# Patient Record
Sex: Male | Born: 1935 | Race: White | Hispanic: No
Health system: Southern US, Community
[De-identification: ages and names within clinical notes are randomized; demographics above are authoritative.]

---

## 2015-07-24 DIAGNOSIS — L97921 Non-pressure chronic ulcer of unspecified part of left lower leg limited to breakdown of skin: Secondary | ICD-10-CM | POA: Diagnosis not present

## 2015-07-24 DIAGNOSIS — I8310 Varicose veins of unspecified lower extremity with inflammation: Secondary | ICD-10-CM | POA: Diagnosis not present

## 2015-07-31 DIAGNOSIS — L97921 Non-pressure chronic ulcer of unspecified part of left lower leg limited to breakdown of skin: Secondary | ICD-10-CM | POA: Diagnosis not present

## 2015-07-31 DIAGNOSIS — I8310 Varicose veins of unspecified lower extremity with inflammation: Secondary | ICD-10-CM | POA: Diagnosis not present

## 2015-07-31 DIAGNOSIS — R600 Localized edema: Secondary | ICD-10-CM | POA: Diagnosis not present

## 2015-08-07 DIAGNOSIS — R600 Localized edema: Secondary | ICD-10-CM | POA: Diagnosis not present

## 2015-08-07 DIAGNOSIS — L97921 Non-pressure chronic ulcer of unspecified part of left lower leg limited to breakdown of skin: Secondary | ICD-10-CM | POA: Diagnosis not present

## 2015-08-07 DIAGNOSIS — I8310 Varicose veins of unspecified lower extremity with inflammation: Secondary | ICD-10-CM | POA: Diagnosis not present

## 2015-08-14 DIAGNOSIS — L97921 Non-pressure chronic ulcer of unspecified part of left lower leg limited to breakdown of skin: Secondary | ICD-10-CM | POA: Diagnosis not present

## 2015-08-14 DIAGNOSIS — I8310 Varicose veins of unspecified lower extremity with inflammation: Secondary | ICD-10-CM | POA: Diagnosis not present

## 2015-08-21 DIAGNOSIS — I8310 Varicose veins of unspecified lower extremity with inflammation: Secondary | ICD-10-CM | POA: Diagnosis not present

## 2015-08-21 DIAGNOSIS — L97921 Non-pressure chronic ulcer of unspecified part of left lower leg limited to breakdown of skin: Secondary | ICD-10-CM | POA: Diagnosis not present

## 2015-08-21 DIAGNOSIS — R600 Localized edema: Secondary | ICD-10-CM | POA: Diagnosis not present

## 2015-08-28 DIAGNOSIS — I8310 Varicose veins of unspecified lower extremity with inflammation: Secondary | ICD-10-CM | POA: Diagnosis not present

## 2015-08-28 DIAGNOSIS — L97921 Non-pressure chronic ulcer of unspecified part of left lower leg limited to breakdown of skin: Secondary | ICD-10-CM | POA: Diagnosis not present

## 2015-09-04 DIAGNOSIS — L97921 Non-pressure chronic ulcer of unspecified part of left lower leg limited to breakdown of skin: Secondary | ICD-10-CM | POA: Diagnosis not present

## 2015-09-04 DIAGNOSIS — I8310 Varicose veins of unspecified lower extremity with inflammation: Secondary | ICD-10-CM | POA: Diagnosis not present

## 2015-09-10 DIAGNOSIS — R7989 Other specified abnormal findings of blood chemistry: Secondary | ICD-10-CM | POA: Diagnosis not present

## 2015-09-10 DIAGNOSIS — E041 Nontoxic single thyroid nodule: Secondary | ICD-10-CM | POA: Diagnosis not present

## 2015-09-25 DIAGNOSIS — L97921 Non-pressure chronic ulcer of unspecified part of left lower leg limited to breakdown of skin: Secondary | ICD-10-CM | POA: Diagnosis not present

## 2015-11-05 DIAGNOSIS — K5900 Constipation, unspecified: Secondary | ICD-10-CM | POA: Diagnosis not present

## 2015-11-07 DIAGNOSIS — R1084 Generalized abdominal pain: Secondary | ICD-10-CM | POA: Diagnosis not present

## 2015-11-07 DIAGNOSIS — K5909 Other constipation: Secondary | ICD-10-CM | POA: Diagnosis not present

## 2015-11-07 DIAGNOSIS — E039 Hypothyroidism, unspecified: Secondary | ICD-10-CM | POA: Diagnosis not present

## 2015-11-07 DIAGNOSIS — R109 Unspecified abdominal pain: Secondary | ICD-10-CM | POA: Diagnosis not present

## 2015-11-07 DIAGNOSIS — K5901 Slow transit constipation: Secondary | ICD-10-CM | POA: Diagnosis not present

## 2015-11-07 DIAGNOSIS — Z79899 Other long term (current) drug therapy: Secondary | ICD-10-CM | POA: Diagnosis not present

## 2015-12-02 DIAGNOSIS — H02135 Senile ectropion of left lower eyelid: Secondary | ICD-10-CM | POA: Diagnosis not present

## 2015-12-02 DIAGNOSIS — H2513 Age-related nuclear cataract, bilateral: Secondary | ICD-10-CM | POA: Diagnosis not present

## 2015-12-02 DIAGNOSIS — H524 Presbyopia: Secondary | ICD-10-CM | POA: Diagnosis not present

## 2017-05-01 DIAGNOSIS — J050 Acute obstructive laryngitis [croup]: Secondary | ICD-10-CM | POA: Diagnosis not present

## 2017-05-01 DIAGNOSIS — J0100 Acute maxillary sinusitis, unspecified: Secondary | ICD-10-CM | POA: Diagnosis not present

## 2017-05-01 DIAGNOSIS — M79675 Pain in left toe(s): Secondary | ICD-10-CM | POA: Diagnosis not present

## 2018-07-14 DIAGNOSIS — I739 Peripheral vascular disease, unspecified: Secondary | ICD-10-CM | POA: Diagnosis not present

## 2018-07-14 DIAGNOSIS — E039 Hypothyroidism, unspecified: Secondary | ICD-10-CM | POA: Diagnosis not present

## 2018-07-14 DIAGNOSIS — M40293 Other kyphosis, cervicothoracic region: Secondary | ICD-10-CM | POA: Diagnosis not present

## 2018-07-14 DIAGNOSIS — R609 Edema, unspecified: Secondary | ICD-10-CM | POA: Diagnosis not present

## 2018-07-14 DIAGNOSIS — M542 Cervicalgia: Secondary | ICD-10-CM | POA: Diagnosis not present

## 2018-07-15 DIAGNOSIS — M546 Pain in thoracic spine: Secondary | ICD-10-CM | POA: Diagnosis not present

## 2018-07-15 DIAGNOSIS — R609 Edema, unspecified: Secondary | ICD-10-CM | POA: Diagnosis not present

## 2018-07-15 DIAGNOSIS — M438X4 Other specified deforming dorsopathies, thoracic region: Secondary | ICD-10-CM | POA: Diagnosis not present

## 2018-07-15 DIAGNOSIS — M47814 Spondylosis without myelopathy or radiculopathy, thoracic region: Secondary | ICD-10-CM | POA: Diagnosis not present

## 2018-07-15 DIAGNOSIS — M40292 Other kyphosis, cervical region: Secondary | ICD-10-CM | POA: Diagnosis not present

## 2018-07-15 DIAGNOSIS — R600 Localized edema: Secondary | ICD-10-CM | POA: Diagnosis not present

## 2018-07-15 DIAGNOSIS — M47812 Spondylosis without myelopathy or radiculopathy, cervical region: Secondary | ICD-10-CM | POA: Diagnosis not present

## 2018-07-15 DIAGNOSIS — M542 Cervicalgia: Secondary | ICD-10-CM | POA: Diagnosis not present

## 2018-07-15 DIAGNOSIS — M8588 Other specified disorders of bone density and structure, other site: Secondary | ICD-10-CM | POA: Diagnosis not present

## 2018-08-01 DIAGNOSIS — D513 Other dietary vitamin B12 deficiency anemia: Secondary | ICD-10-CM | POA: Diagnosis not present

## 2018-08-22 DIAGNOSIS — C44722 Squamous cell carcinoma of skin of right lower limb, including hip: Secondary | ICD-10-CM | POA: Diagnosis not present

## 2018-09-05 DIAGNOSIS — L97921 Non-pressure chronic ulcer of unspecified part of left lower leg limited to breakdown of skin: Secondary | ICD-10-CM | POA: Diagnosis not present

## 2018-09-05 DIAGNOSIS — L570 Actinic keratosis: Secondary | ICD-10-CM | POA: Diagnosis not present

## 2018-09-10 DIAGNOSIS — S199XXA Unspecified injury of neck, initial encounter: Secondary | ICD-10-CM | POA: Diagnosis not present

## 2018-09-10 DIAGNOSIS — W19XXXA Unspecified fall, initial encounter: Secondary | ICD-10-CM | POA: Diagnosis not present

## 2018-09-10 DIAGNOSIS — S60512A Abrasion of left hand, initial encounter: Secondary | ICD-10-CM | POA: Diagnosis not present

## 2018-09-10 DIAGNOSIS — S0101XA Laceration without foreign body of scalp, initial encounter: Secondary | ICD-10-CM | POA: Diagnosis not present

## 2018-09-10 DIAGNOSIS — S0990XA Unspecified injury of head, initial encounter: Secondary | ICD-10-CM | POA: Diagnosis not present

## 2018-09-10 DIAGNOSIS — S61012A Laceration without foreign body of left thumb without damage to nail, initial encounter: Secondary | ICD-10-CM | POA: Diagnosis not present

## 2018-09-10 DIAGNOSIS — R0902 Hypoxemia: Secondary | ICD-10-CM | POA: Diagnosis not present

## 2018-09-10 DIAGNOSIS — S0001XA Abrasion of scalp, initial encounter: Secondary | ICD-10-CM | POA: Diagnosis not present

## 2018-09-12 DIAGNOSIS — L97911 Non-pressure chronic ulcer of unspecified part of right lower leg limited to breakdown of skin: Secondary | ICD-10-CM | POA: Diagnosis not present

## 2018-09-19 DIAGNOSIS — L97911 Non-pressure chronic ulcer of unspecified part of right lower leg limited to breakdown of skin: Secondary | ICD-10-CM | POA: Diagnosis not present

## 2018-09-30 DIAGNOSIS — I8310 Varicose veins of unspecified lower extremity with inflammation: Secondary | ICD-10-CM | POA: Diagnosis not present

## 2018-09-30 DIAGNOSIS — L97911 Non-pressure chronic ulcer of unspecified part of right lower leg limited to breakdown of skin: Secondary | ICD-10-CM | POA: Diagnosis not present

## 2018-09-30 DIAGNOSIS — R600 Localized edema: Secondary | ICD-10-CM | POA: Diagnosis not present

## 2018-10-06 DIAGNOSIS — L97911 Non-pressure chronic ulcer of unspecified part of right lower leg limited to breakdown of skin: Secondary | ICD-10-CM | POA: Diagnosis not present

## 2018-10-06 DIAGNOSIS — I8310 Varicose veins of unspecified lower extremity with inflammation: Secondary | ICD-10-CM | POA: Diagnosis not present

## 2018-10-13 DIAGNOSIS — L97911 Non-pressure chronic ulcer of unspecified part of right lower leg limited to breakdown of skin: Secondary | ICD-10-CM | POA: Diagnosis not present

## 2018-10-13 DIAGNOSIS — R600 Localized edema: Secondary | ICD-10-CM | POA: Diagnosis not present

## 2018-10-13 DIAGNOSIS — I8310 Varicose veins of unspecified lower extremity with inflammation: Secondary | ICD-10-CM | POA: Diagnosis not present

## 2018-10-27 DIAGNOSIS — E559 Vitamin D deficiency, unspecified: Secondary | ICD-10-CM | POA: Diagnosis not present

## 2018-10-27 DIAGNOSIS — I739 Peripheral vascular disease, unspecified: Secondary | ICD-10-CM | POA: Diagnosis not present

## 2018-10-27 DIAGNOSIS — Z23 Encounter for immunization: Secondary | ICD-10-CM | POA: Diagnosis not present

## 2018-10-27 DIAGNOSIS — E039 Hypothyroidism, unspecified: Secondary | ICD-10-CM | POA: Diagnosis not present

## 2018-10-27 DIAGNOSIS — Z299 Encounter for prophylactic measures, unspecified: Secondary | ICD-10-CM | POA: Diagnosis not present

## 2018-10-27 DIAGNOSIS — Z789 Other specified health status: Secondary | ICD-10-CM | POA: Diagnosis not present

## 2018-10-27 DIAGNOSIS — D513 Other dietary vitamin B12 deficiency anemia: Secondary | ICD-10-CM | POA: Diagnosis not present

## 2018-10-27 DIAGNOSIS — F0280 Dementia in other diseases classified elsewhere without behavioral disturbance: Secondary | ICD-10-CM | POA: Diagnosis not present

## 2018-10-27 DIAGNOSIS — Z6824 Body mass index (BMI) 24.0-24.9, adult: Secondary | ICD-10-CM | POA: Diagnosis not present

## 2018-10-27 DIAGNOSIS — G309 Alzheimer's disease, unspecified: Secondary | ICD-10-CM | POA: Diagnosis not present

## 2018-10-27 DIAGNOSIS — I10 Essential (primary) hypertension: Secondary | ICD-10-CM | POA: Diagnosis not present

## 2018-10-27 DIAGNOSIS — G4700 Insomnia, unspecified: Secondary | ICD-10-CM | POA: Diagnosis not present

## 2018-10-27 DIAGNOSIS — N183 Chronic kidney disease, stage 3 unspecified: Secondary | ICD-10-CM | POA: Diagnosis not present

## 2018-10-27 DIAGNOSIS — I2510 Atherosclerotic heart disease of native coronary artery without angina pectoris: Secondary | ICD-10-CM | POA: Diagnosis not present

## 2018-10-27 DIAGNOSIS — R413 Other amnesia: Secondary | ICD-10-CM | POA: Diagnosis not present

## 2018-10-27 DIAGNOSIS — F332 Major depressive disorder, recurrent severe without psychotic features: Secondary | ICD-10-CM | POA: Diagnosis not present

## 2018-10-27 DIAGNOSIS — L900 Lichen sclerosus et atrophicus: Secondary | ICD-10-CM | POA: Diagnosis not present

## 2018-10-31 DIAGNOSIS — E539 Vitamin B deficiency, unspecified: Secondary | ICD-10-CM | POA: Diagnosis not present

## 2018-10-31 DIAGNOSIS — E559 Vitamin D deficiency, unspecified: Secondary | ICD-10-CM | POA: Diagnosis not present

## 2018-10-31 DIAGNOSIS — L84 Corns and callosities: Secondary | ICD-10-CM | POA: Diagnosis not present

## 2018-11-08 DIAGNOSIS — L97911 Non-pressure chronic ulcer of unspecified part of right lower leg limited to breakdown of skin: Secondary | ICD-10-CM | POA: Diagnosis not present

## 2018-11-08 DIAGNOSIS — I8310 Varicose veins of unspecified lower extremity with inflammation: Secondary | ICD-10-CM | POA: Diagnosis not present

## 2018-11-08 DIAGNOSIS — R600 Localized edema: Secondary | ICD-10-CM | POA: Diagnosis not present

## 2018-11-24 DIAGNOSIS — L97911 Non-pressure chronic ulcer of unspecified part of right lower leg limited to breakdown of skin: Secondary | ICD-10-CM | POA: Diagnosis not present

## 2018-12-01 DIAGNOSIS — L97911 Non-pressure chronic ulcer of unspecified part of right lower leg limited to breakdown of skin: Secondary | ICD-10-CM | POA: Diagnosis not present

## 2018-12-01 DIAGNOSIS — L0390 Cellulitis, unspecified: Secondary | ICD-10-CM | POA: Diagnosis not present

## 2018-12-08 DIAGNOSIS — L97911 Non-pressure chronic ulcer of unspecified part of right lower leg limited to breakdown of skin: Secondary | ICD-10-CM | POA: Diagnosis not present

## 2018-12-20 DIAGNOSIS — L97911 Non-pressure chronic ulcer of unspecified part of right lower leg limited to breakdown of skin: Secondary | ICD-10-CM | POA: Diagnosis not present

## 2019-01-05 DIAGNOSIS — R600 Localized edema: Secondary | ICD-10-CM | POA: Diagnosis not present

## 2019-01-05 DIAGNOSIS — M79604 Pain in right leg: Secondary | ICD-10-CM | POA: Diagnosis not present

## 2019-01-05 DIAGNOSIS — L97911 Non-pressure chronic ulcer of unspecified part of right lower leg limited to breakdown of skin: Secondary | ICD-10-CM | POA: Diagnosis not present

## 2019-01-05 DIAGNOSIS — M7989 Other specified soft tissue disorders: Secondary | ICD-10-CM | POA: Diagnosis not present

## 2019-01-12 DIAGNOSIS — L97911 Non-pressure chronic ulcer of unspecified part of right lower leg limited to breakdown of skin: Secondary | ICD-10-CM | POA: Diagnosis not present

## 2019-01-12 DIAGNOSIS — D485 Neoplasm of uncertain behavior of skin: Secondary | ICD-10-CM | POA: Diagnosis not present

## 2019-01-12 DIAGNOSIS — L308 Other specified dermatitis: Secondary | ICD-10-CM | POA: Diagnosis not present

## 2019-01-17 DIAGNOSIS — L97911 Non-pressure chronic ulcer of unspecified part of right lower leg limited to breakdown of skin: Secondary | ICD-10-CM | POA: Diagnosis not present

## 2019-01-17 DIAGNOSIS — I8310 Varicose veins of unspecified lower extremity with inflammation: Secondary | ICD-10-CM | POA: Diagnosis not present

## 2019-01-17 DIAGNOSIS — R600 Localized edema: Secondary | ICD-10-CM | POA: Diagnosis not present

## 2019-01-24 DIAGNOSIS — L97911 Non-pressure chronic ulcer of unspecified part of right lower leg limited to breakdown of skin: Secondary | ICD-10-CM | POA: Diagnosis not present

## 2019-01-25 DIAGNOSIS — D513 Other dietary vitamin B12 deficiency anemia: Secondary | ICD-10-CM | POA: Diagnosis not present

## 2019-01-25 DIAGNOSIS — D519 Vitamin B12 deficiency anemia, unspecified: Secondary | ICD-10-CM | POA: Diagnosis not present

## 2019-01-25 DIAGNOSIS — I739 Peripheral vascular disease, unspecified: Secondary | ICD-10-CM | POA: Diagnosis not present

## 2019-01-25 DIAGNOSIS — E039 Hypothyroidism, unspecified: Secondary | ICD-10-CM | POA: Diagnosis not present

## 2019-01-26 DIAGNOSIS — E039 Hypothyroidism, unspecified: Secondary | ICD-10-CM | POA: Diagnosis not present

## 2019-01-26 DIAGNOSIS — E559 Vitamin D deficiency, unspecified: Secondary | ICD-10-CM | POA: Diagnosis not present

## 2019-01-26 DIAGNOSIS — D513 Other dietary vitamin B12 deficiency anemia: Secondary | ICD-10-CM | POA: Diagnosis not present

## 2019-02-02 DIAGNOSIS — R600 Localized edema: Secondary | ICD-10-CM | POA: Diagnosis not present

## 2019-02-02 DIAGNOSIS — L97911 Non-pressure chronic ulcer of unspecified part of right lower leg limited to breakdown of skin: Secondary | ICD-10-CM | POA: Diagnosis not present

## 2019-02-09 DIAGNOSIS — L97911 Non-pressure chronic ulcer of unspecified part of right lower leg limited to breakdown of skin: Secondary | ICD-10-CM | POA: Diagnosis not present

## 2019-03-02 DIAGNOSIS — I8310 Varicose veins of unspecified lower extremity with inflammation: Secondary | ICD-10-CM | POA: Diagnosis not present

## 2019-03-02 DIAGNOSIS — L97911 Non-pressure chronic ulcer of unspecified part of right lower leg limited to breakdown of skin: Secondary | ICD-10-CM | POA: Diagnosis not present

## 2019-03-02 DIAGNOSIS — R600 Localized edema: Secondary | ICD-10-CM | POA: Diagnosis not present

## 2019-03-16 DIAGNOSIS — S01311A Laceration without foreign body of right ear, initial encounter: Secondary | ICD-10-CM | POA: Diagnosis not present

## 2019-03-16 DIAGNOSIS — L97919 Non-pressure chronic ulcer of unspecified part of right lower leg with unspecified severity: Secondary | ICD-10-CM | POA: Diagnosis not present

## 2019-03-16 DIAGNOSIS — S080XXA Avulsion of scalp, initial encounter: Secondary | ICD-10-CM | POA: Diagnosis not present

## 2019-03-18 DIAGNOSIS — S080XXA Avulsion of scalp, initial encounter: Secondary | ICD-10-CM | POA: Diagnosis not present

## 2019-03-22 DIAGNOSIS — I87311 Chronic venous hypertension (idiopathic) with ulcer of right lower extremity: Secondary | ICD-10-CM | POA: Diagnosis not present

## 2019-03-22 DIAGNOSIS — I10 Essential (primary) hypertension: Secondary | ICD-10-CM | POA: Diagnosis not present

## 2019-03-22 DIAGNOSIS — L97812 Non-pressure chronic ulcer of other part of right lower leg with fat layer exposed: Secondary | ICD-10-CM | POA: Diagnosis not present

## 2019-03-22 DIAGNOSIS — J449 Chronic obstructive pulmonary disease, unspecified: Secondary | ICD-10-CM | POA: Diagnosis not present

## 2019-03-23 DIAGNOSIS — M25571 Pain in right ankle and joints of right foot: Secondary | ICD-10-CM | POA: Diagnosis not present

## 2019-03-23 DIAGNOSIS — R52 Pain, unspecified: Secondary | ICD-10-CM | POA: Diagnosis not present

## 2019-03-24 DIAGNOSIS — I739 Peripheral vascular disease, unspecified: Secondary | ICD-10-CM | POA: Diagnosis not present

## 2019-03-24 DIAGNOSIS — L03115 Cellulitis of right lower limb: Secondary | ICD-10-CM | POA: Diagnosis not present

## 2019-03-24 DIAGNOSIS — Z79899 Other long term (current) drug therapy: Secondary | ICD-10-CM | POA: Diagnosis not present

## 2019-03-24 DIAGNOSIS — S81801A Unspecified open wound, right lower leg, initial encounter: Secondary | ICD-10-CM | POA: Diagnosis not present

## 2019-03-24 DIAGNOSIS — J449 Chronic obstructive pulmonary disease, unspecified: Secondary | ICD-10-CM | POA: Diagnosis not present

## 2019-03-24 DIAGNOSIS — E039 Hypothyroidism, unspecified: Secondary | ICD-10-CM | POA: Diagnosis not present

## 2019-03-24 DIAGNOSIS — M1990 Unspecified osteoarthritis, unspecified site: Secondary | ICD-10-CM | POA: Diagnosis not present

## 2019-03-24 DIAGNOSIS — L97919 Non-pressure chronic ulcer of unspecified part of right lower leg with unspecified severity: Secondary | ICD-10-CM | POA: Diagnosis not present

## 2019-03-25 DIAGNOSIS — L03115 Cellulitis of right lower limb: Secondary | ICD-10-CM | POA: Diagnosis not present

## 2019-03-25 DIAGNOSIS — L97919 Non-pressure chronic ulcer of unspecified part of right lower leg with unspecified severity: Secondary | ICD-10-CM | POA: Diagnosis not present

## 2019-03-28 DIAGNOSIS — I10 Essential (primary) hypertension: Secondary | ICD-10-CM | POA: Diagnosis not present

## 2019-03-28 DIAGNOSIS — I70238 Atherosclerosis of native arteries of right leg with ulceration of other part of lower right leg: Secondary | ICD-10-CM | POA: Diagnosis not present

## 2019-03-28 DIAGNOSIS — L97812 Non-pressure chronic ulcer of other part of right lower leg with fat layer exposed: Secondary | ICD-10-CM | POA: Diagnosis not present

## 2019-03-28 DIAGNOSIS — I87311 Chronic venous hypertension (idiopathic) with ulcer of right lower extremity: Secondary | ICD-10-CM | POA: Diagnosis not present

## 2019-03-28 DIAGNOSIS — J449 Chronic obstructive pulmonary disease, unspecified: Secondary | ICD-10-CM | POA: Diagnosis not present

## 2019-03-29 DIAGNOSIS — Z79899 Other long term (current) drug therapy: Secondary | ICD-10-CM | POA: Diagnosis not present

## 2019-03-29 DIAGNOSIS — Z20828 Contact with and (suspected) exposure to other viral communicable diseases: Secondary | ICD-10-CM | POA: Diagnosis not present

## 2019-03-29 DIAGNOSIS — E039 Hypothyroidism, unspecified: Secondary | ICD-10-CM | POA: Diagnosis not present

## 2019-03-29 DIAGNOSIS — R079 Chest pain, unspecified: Secondary | ICD-10-CM | POA: Diagnosis not present

## 2019-03-31 DIAGNOSIS — Z79899 Other long term (current) drug therapy: Secondary | ICD-10-CM | POA: Diagnosis not present

## 2019-03-31 DIAGNOSIS — L97812 Non-pressure chronic ulcer of other part of right lower leg with fat layer exposed: Secondary | ICD-10-CM | POA: Diagnosis not present

## 2019-03-31 DIAGNOSIS — Z9181 History of falling: Secondary | ICD-10-CM | POA: Diagnosis not present

## 2019-03-31 DIAGNOSIS — I10 Essential (primary) hypertension: Secondary | ICD-10-CM | POA: Diagnosis not present

## 2019-03-31 DIAGNOSIS — Z791 Long term (current) use of non-steroidal anti-inflammatories (NSAID): Secondary | ICD-10-CM | POA: Diagnosis not present

## 2019-03-31 DIAGNOSIS — J310 Chronic rhinitis: Secondary | ICD-10-CM | POA: Diagnosis not present

## 2019-03-31 DIAGNOSIS — E039 Hypothyroidism, unspecified: Secondary | ICD-10-CM | POA: Diagnosis not present

## 2019-03-31 DIAGNOSIS — I87311 Chronic venous hypertension (idiopathic) with ulcer of right lower extremity: Secondary | ICD-10-CM | POA: Diagnosis not present

## 2019-03-31 DIAGNOSIS — R609 Edema, unspecified: Secondary | ICD-10-CM | POA: Diagnosis not present

## 2019-03-31 DIAGNOSIS — R2681 Unsteadiness on feet: Secondary | ICD-10-CM | POA: Diagnosis not present

## 2019-03-31 DIAGNOSIS — R5383 Other fatigue: Secondary | ICD-10-CM | POA: Diagnosis not present

## 2019-03-31 DIAGNOSIS — R531 Weakness: Secondary | ICD-10-CM | POA: Diagnosis not present

## 2019-03-31 DIAGNOSIS — Z7951 Long term (current) use of inhaled steroids: Secondary | ICD-10-CM | POA: Diagnosis not present

## 2019-03-31 DIAGNOSIS — J449 Chronic obstructive pulmonary disease, unspecified: Secondary | ICD-10-CM | POA: Diagnosis not present

## 2019-04-04 DIAGNOSIS — I70238 Atherosclerosis of native arteries of right leg with ulceration of other part of lower right leg: Secondary | ICD-10-CM | POA: Diagnosis not present

## 2019-04-04 DIAGNOSIS — I10 Essential (primary) hypertension: Secondary | ICD-10-CM | POA: Diagnosis not present

## 2019-04-04 DIAGNOSIS — J449 Chronic obstructive pulmonary disease, unspecified: Secondary | ICD-10-CM | POA: Diagnosis not present

## 2019-04-04 DIAGNOSIS — I87311 Chronic venous hypertension (idiopathic) with ulcer of right lower extremity: Secondary | ICD-10-CM | POA: Diagnosis not present

## 2019-04-04 DIAGNOSIS — L97812 Non-pressure chronic ulcer of other part of right lower leg with fat layer exposed: Secondary | ICD-10-CM | POA: Diagnosis not present

## 2019-04-07 DIAGNOSIS — E559 Vitamin D deficiency, unspecified: Secondary | ICD-10-CM | POA: Diagnosis not present

## 2019-04-07 DIAGNOSIS — Z79899 Other long term (current) drug therapy: Secondary | ICD-10-CM | POA: Diagnosis not present

## 2019-04-07 DIAGNOSIS — D649 Anemia, unspecified: Secondary | ICD-10-CM | POA: Diagnosis not present

## 2019-04-07 DIAGNOSIS — M40293 Other kyphosis, cervicothoracic region: Secondary | ICD-10-CM | POA: Diagnosis not present

## 2019-04-07 DIAGNOSIS — E039 Hypothyroidism, unspecified: Secondary | ICD-10-CM | POA: Diagnosis not present

## 2019-04-07 DIAGNOSIS — I739 Peripheral vascular disease, unspecified: Secondary | ICD-10-CM | POA: Diagnosis not present

## 2019-04-15 DIAGNOSIS — E039 Hypothyroidism, unspecified: Secondary | ICD-10-CM | POA: Diagnosis not present

## 2019-04-15 DIAGNOSIS — S0101XA Laceration without foreign body of scalp, initial encounter: Secondary | ICD-10-CM | POA: Diagnosis not present

## 2019-04-15 DIAGNOSIS — R42 Dizziness and giddiness: Secondary | ICD-10-CM | POA: Diagnosis not present

## 2019-04-15 DIAGNOSIS — W19XXXA Unspecified fall, initial encounter: Secondary | ICD-10-CM | POA: Diagnosis not present

## 2019-04-15 DIAGNOSIS — H6123 Impacted cerumen, bilateral: Secondary | ICD-10-CM | POA: Diagnosis not present

## 2019-04-15 DIAGNOSIS — S0003XA Contusion of scalp, initial encounter: Secondary | ICD-10-CM | POA: Diagnosis not present

## 2019-04-15 DIAGNOSIS — I10 Essential (primary) hypertension: Secondary | ICD-10-CM | POA: Diagnosis not present

## 2019-04-15 DIAGNOSIS — Z79899 Other long term (current) drug therapy: Secondary | ICD-10-CM | POA: Diagnosis not present

## 2019-04-15 DIAGNOSIS — R51 Headache: Secondary | ICD-10-CM | POA: Diagnosis not present

## 2019-04-15 DIAGNOSIS — I4510 Unspecified right bundle-branch block: Secondary | ICD-10-CM | POA: Diagnosis not present

## 2019-04-17 DIAGNOSIS — I70248 Atherosclerosis of native arteries of left leg with ulceration of other part of lower left leg: Secondary | ICD-10-CM | POA: Diagnosis not present

## 2019-04-17 DIAGNOSIS — L97812 Non-pressure chronic ulcer of other part of right lower leg with fat layer exposed: Secondary | ICD-10-CM | POA: Diagnosis not present

## 2019-04-17 DIAGNOSIS — I70238 Atherosclerosis of native arteries of right leg with ulceration of other part of lower right leg: Secondary | ICD-10-CM | POA: Diagnosis not present

## 2019-04-17 DIAGNOSIS — L97822 Non-pressure chronic ulcer of other part of left lower leg with fat layer exposed: Secondary | ICD-10-CM | POA: Diagnosis not present

## 2019-04-17 DIAGNOSIS — I87311 Chronic venous hypertension (idiopathic) with ulcer of right lower extremity: Secondary | ICD-10-CM | POA: Diagnosis not present

## 2019-04-24 ENCOUNTER — Other Ambulatory Visit: Payer: Self-pay | Admitting: *Deleted

## 2019-04-24 DIAGNOSIS — I70248 Atherosclerosis of native arteries of left leg with ulceration of other part of lower left leg: Secondary | ICD-10-CM | POA: Diagnosis not present

## 2019-04-24 DIAGNOSIS — L97812 Non-pressure chronic ulcer of other part of right lower leg with fat layer exposed: Secondary | ICD-10-CM | POA: Diagnosis not present

## 2019-04-24 DIAGNOSIS — I87311 Chronic venous hypertension (idiopathic) with ulcer of right lower extremity: Secondary | ICD-10-CM | POA: Diagnosis not present

## 2019-04-24 DIAGNOSIS — I70238 Atherosclerosis of native arteries of right leg with ulceration of other part of lower right leg: Secondary | ICD-10-CM | POA: Diagnosis not present

## 2019-04-24 DIAGNOSIS — L97822 Non-pressure chronic ulcer of other part of left lower leg with fat layer exposed: Secondary | ICD-10-CM | POA: Diagnosis not present

## 2019-04-24 NOTE — Patient Outreach (Signed)
[facility]) Care Management  04/24/2019  Antonio Estes. Apr 02, 1927 [id number]   CSW made initial contact with pt who confirmed his identity.  CSW introduced self and role and reason for call.   CSW began to attempt to ask some screening type questions; pt reports he lives alone, has help from his neighbors help and denies any needs.  Pt did not like the inquiries/questions being asked to assess further and CSW attempted to explain to him why [facility] was consulted and why I was asking the questions.  He continued to talk and answer a few questions but again became irritated by the (personal) questions and politely declined CSW services. CSW will advise PCP and [facility] team and plan to sign off at this time.   Eduard Clos, MSW, [place] Worker  [place] [phone]

## 2019-04-30 DIAGNOSIS — I87311 Chronic venous hypertension (idiopathic) with ulcer of right lower extremity: Secondary | ICD-10-CM | POA: Diagnosis not present

## 2019-04-30 DIAGNOSIS — L97812 Non-pressure chronic ulcer of other part of right lower leg with fat layer exposed: Secondary | ICD-10-CM | POA: Diagnosis not present

## 2019-04-30 DIAGNOSIS — E039 Hypothyroidism, unspecified: Secondary | ICD-10-CM | POA: Diagnosis not present

## 2019-04-30 DIAGNOSIS — I10 Essential (primary) hypertension: Secondary | ICD-10-CM | POA: Diagnosis not present

## 2019-04-30 DIAGNOSIS — J449 Chronic obstructive pulmonary disease, unspecified: Secondary | ICD-10-CM | POA: Diagnosis not present

## 2019-04-30 DIAGNOSIS — R5383 Other fatigue: Secondary | ICD-10-CM | POA: Diagnosis not present

## 2019-04-30 DIAGNOSIS — Z791 Long term (current) use of non-steroidal anti-inflammatories (NSAID): Secondary | ICD-10-CM | POA: Diagnosis not present

## 2019-04-30 DIAGNOSIS — Z9181 History of falling: Secondary | ICD-10-CM | POA: Diagnosis not present

## 2019-04-30 DIAGNOSIS — Z7951 Long term (current) use of inhaled steroids: Secondary | ICD-10-CM | POA: Diagnosis not present

## 2019-04-30 DIAGNOSIS — R2681 Unsteadiness on feet: Secondary | ICD-10-CM | POA: Diagnosis not present

## 2019-04-30 DIAGNOSIS — R531 Weakness: Secondary | ICD-10-CM | POA: Diagnosis not present

## 2019-04-30 DIAGNOSIS — Z79899 Other long term (current) drug therapy: Secondary | ICD-10-CM | POA: Diagnosis not present

## 2019-05-08 DIAGNOSIS — L97812 Non-pressure chronic ulcer of other part of right lower leg with fat layer exposed: Secondary | ICD-10-CM | POA: Diagnosis not present

## 2019-05-08 DIAGNOSIS — I70248 Atherosclerosis of native arteries of left leg with ulceration of other part of lower left leg: Secondary | ICD-10-CM | POA: Diagnosis not present

## 2019-05-08 DIAGNOSIS — L97822 Non-pressure chronic ulcer of other part of left lower leg with fat layer exposed: Secondary | ICD-10-CM | POA: Diagnosis not present

## 2019-05-08 DIAGNOSIS — J449 Chronic obstructive pulmonary disease, unspecified: Secondary | ICD-10-CM | POA: Diagnosis not present

## 2019-05-08 DIAGNOSIS — I70238 Atherosclerosis of native arteries of right leg with ulceration of other part of lower right leg: Secondary | ICD-10-CM | POA: Diagnosis not present

## 2019-05-08 DIAGNOSIS — I10 Essential (primary) hypertension: Secondary | ICD-10-CM | POA: Diagnosis not present

## 2019-05-08 DIAGNOSIS — I87311 Chronic venous hypertension (idiopathic) with ulcer of right lower extremity: Secondary | ICD-10-CM | POA: Diagnosis not present

## 2019-05-15 DIAGNOSIS — L97529 Non-pressure chronic ulcer of other part of left foot with unspecified severity: Secondary | ICD-10-CM | POA: Diagnosis not present

## 2019-05-15 DIAGNOSIS — I70238 Atherosclerosis of native arteries of right leg with ulceration of other part of lower right leg: Secondary | ICD-10-CM | POA: Diagnosis not present

## 2019-05-15 DIAGNOSIS — L97829 Non-pressure chronic ulcer of other part of left lower leg with unspecified severity: Secondary | ICD-10-CM | POA: Diagnosis not present

## 2019-05-15 DIAGNOSIS — J449 Chronic obstructive pulmonary disease, unspecified: Secondary | ICD-10-CM | POA: Diagnosis not present

## 2019-05-15 DIAGNOSIS — L97812 Non-pressure chronic ulcer of other part of right lower leg with fat layer exposed: Secondary | ICD-10-CM | POA: Diagnosis not present

## 2019-05-15 DIAGNOSIS — I10 Essential (primary) hypertension: Secondary | ICD-10-CM | POA: Diagnosis not present

## 2019-05-15 DIAGNOSIS — I87311 Chronic venous hypertension (idiopathic) with ulcer of right lower extremity: Secondary | ICD-10-CM | POA: Diagnosis not present

## 2019-05-15 DIAGNOSIS — L97822 Non-pressure chronic ulcer of other part of left lower leg with fat layer exposed: Secondary | ICD-10-CM | POA: Diagnosis not present

## 2019-05-15 DIAGNOSIS — I70248 Atherosclerosis of native arteries of left leg with ulceration of other part of lower left leg: Secondary | ICD-10-CM | POA: Diagnosis not present

## 2019-05-22 DIAGNOSIS — L97812 Non-pressure chronic ulcer of other part of right lower leg with fat layer exposed: Secondary | ICD-10-CM | POA: Diagnosis not present

## 2019-05-22 DIAGNOSIS — I87311 Chronic venous hypertension (idiopathic) with ulcer of right lower extremity: Secondary | ICD-10-CM | POA: Diagnosis not present

## 2019-05-22 DIAGNOSIS — J449 Chronic obstructive pulmonary disease, unspecified: Secondary | ICD-10-CM | POA: Diagnosis not present

## 2019-05-22 DIAGNOSIS — L97522 Non-pressure chronic ulcer of other part of left foot with fat layer exposed: Secondary | ICD-10-CM | POA: Diagnosis not present

## 2019-05-22 DIAGNOSIS — I70238 Atherosclerosis of native arteries of right leg with ulceration of other part of lower right leg: Secondary | ICD-10-CM | POA: Diagnosis not present

## 2019-05-22 DIAGNOSIS — I70248 Atherosclerosis of native arteries of left leg with ulceration of other part of lower left leg: Secondary | ICD-10-CM | POA: Diagnosis not present

## 2019-05-22 DIAGNOSIS — L97822 Non-pressure chronic ulcer of other part of left lower leg with fat layer exposed: Secondary | ICD-10-CM | POA: Diagnosis not present

## 2019-05-22 DIAGNOSIS — I10 Essential (primary) hypertension: Secondary | ICD-10-CM | POA: Diagnosis not present

## 2019-05-30 DIAGNOSIS — I10 Essential (primary) hypertension: Secondary | ICD-10-CM | POA: Diagnosis not present

## 2019-05-30 DIAGNOSIS — R5383 Other fatigue: Secondary | ICD-10-CM | POA: Diagnosis not present

## 2019-05-30 DIAGNOSIS — R2681 Unsteadiness on feet: Secondary | ICD-10-CM | POA: Diagnosis not present

## 2019-05-30 DIAGNOSIS — I87311 Chronic venous hypertension (idiopathic) with ulcer of right lower extremity: Secondary | ICD-10-CM | POA: Diagnosis not present

## 2019-05-30 DIAGNOSIS — R531 Weakness: Secondary | ICD-10-CM | POA: Diagnosis not present

## 2019-05-30 DIAGNOSIS — Z9181 History of falling: Secondary | ICD-10-CM | POA: Diagnosis not present

## 2019-05-30 DIAGNOSIS — Z791 Long term (current) use of non-steroidal anti-inflammatories (NSAID): Secondary | ICD-10-CM | POA: Diagnosis not present

## 2019-05-30 DIAGNOSIS — E039 Hypothyroidism, unspecified: Secondary | ICD-10-CM | POA: Diagnosis not present

## 2019-05-30 DIAGNOSIS — Z7951 Long term (current) use of inhaled steroids: Secondary | ICD-10-CM | POA: Diagnosis not present

## 2019-05-30 DIAGNOSIS — J449 Chronic obstructive pulmonary disease, unspecified: Secondary | ICD-10-CM | POA: Diagnosis not present

## 2019-05-30 DIAGNOSIS — L97812 Non-pressure chronic ulcer of other part of right lower leg with fat layer exposed: Secondary | ICD-10-CM | POA: Diagnosis not present

## 2019-05-30 DIAGNOSIS — I87312 Chronic venous hypertension (idiopathic) with ulcer of left lower extremity: Secondary | ICD-10-CM | POA: Diagnosis not present

## 2019-05-30 DIAGNOSIS — Z79899 Other long term (current) drug therapy: Secondary | ICD-10-CM | POA: Diagnosis not present

## 2019-06-02 DIAGNOSIS — S91001A Unspecified open wound, right ankle, initial encounter: Secondary | ICD-10-CM | POA: Diagnosis not present

## 2019-06-02 DIAGNOSIS — R531 Weakness: Secondary | ICD-10-CM | POA: Diagnosis not present

## 2019-06-02 DIAGNOSIS — B9689 Other specified bacterial agents as the cause of diseases classified elsewhere: Secondary | ICD-10-CM | POA: Diagnosis not present

## 2019-06-02 DIAGNOSIS — W19XXXA Unspecified fall, initial encounter: Secondary | ICD-10-CM | POA: Diagnosis not present

## 2019-06-02 DIAGNOSIS — S91302A Unspecified open wound, left foot, initial encounter: Secondary | ICD-10-CM | POA: Diagnosis not present

## 2019-06-02 DIAGNOSIS — S91301A Unspecified open wound, right foot, initial encounter: Secondary | ICD-10-CM | POA: Diagnosis not present

## 2019-06-02 DIAGNOSIS — S91002A Unspecified open wound, left ankle, initial encounter: Secondary | ICD-10-CM | POA: Diagnosis not present

## 2019-06-02 DIAGNOSIS — R52 Pain, unspecified: Secondary | ICD-10-CM | POA: Diagnosis not present

## 2019-06-02 DIAGNOSIS — L03115 Cellulitis of right lower limb: Secondary | ICD-10-CM | POA: Diagnosis not present

## 2019-06-03 DIAGNOSIS — D649 Anemia, unspecified: Secondary | ICD-10-CM | POA: Diagnosis not present

## 2019-06-03 DIAGNOSIS — K4090 Unilateral inguinal hernia, without obstruction or gangrene, not specified as recurrent: Secondary | ICD-10-CM | POA: Diagnosis not present

## 2019-06-03 DIAGNOSIS — E43 Unspecified severe protein-calorie malnutrition: Secondary | ICD-10-CM | POA: Diagnosis not present

## 2019-06-03 DIAGNOSIS — I739 Peripheral vascular disease, unspecified: Secondary | ICD-10-CM | POA: Diagnosis not present

## 2019-06-03 DIAGNOSIS — S91301A Unspecified open wound, right foot, initial encounter: Secondary | ICD-10-CM | POA: Diagnosis not present

## 2019-06-03 DIAGNOSIS — L97919 Non-pressure chronic ulcer of unspecified part of right lower leg with unspecified severity: Secondary | ICD-10-CM | POA: Diagnosis not present

## 2019-06-03 DIAGNOSIS — E860 Dehydration: Secondary | ICD-10-CM | POA: Diagnosis not present

## 2019-06-03 DIAGNOSIS — R531 Weakness: Secondary | ICD-10-CM | POA: Diagnosis not present

## 2019-06-03 DIAGNOSIS — Z79899 Other long term (current) drug therapy: Secondary | ICD-10-CM | POA: Diagnosis not present

## 2019-06-03 DIAGNOSIS — R627 Adult failure to thrive: Secondary | ICD-10-CM | POA: Diagnosis not present

## 2019-06-03 DIAGNOSIS — L03115 Cellulitis of right lower limb: Secondary | ICD-10-CM | POA: Diagnosis not present

## 2019-06-03 DIAGNOSIS — S91001A Unspecified open wound, right ankle, initial encounter: Secondary | ICD-10-CM | POA: Diagnosis not present

## 2019-06-03 DIAGNOSIS — I70213 Atherosclerosis of native arteries of extremities with intermittent claudication, bilateral legs: Secondary | ICD-10-CM | POA: Diagnosis not present

## 2019-06-03 DIAGNOSIS — L97829 Non-pressure chronic ulcer of other part of left lower leg with unspecified severity: Secondary | ICD-10-CM | POA: Diagnosis not present

## 2019-06-03 DIAGNOSIS — E039 Hypothyroidism, unspecified: Secondary | ICD-10-CM | POA: Diagnosis not present

## 2019-06-03 DIAGNOSIS — Z6822 Body mass index (BMI) 22.0-22.9, adult: Secondary | ICD-10-CM | POA: Diagnosis not present

## 2019-06-03 DIAGNOSIS — S91002A Unspecified open wound, left ankle, initial encounter: Secondary | ICD-10-CM | POA: Diagnosis not present

## 2019-06-03 DIAGNOSIS — M79662 Pain in left lower leg: Secondary | ICD-10-CM | POA: Diagnosis not present

## 2019-06-03 DIAGNOSIS — M47812 Spondylosis without myelopathy or radiculopathy, cervical region: Secondary | ICD-10-CM | POA: Diagnosis not present

## 2019-06-03 DIAGNOSIS — I70223 Atherosclerosis of native arteries of extremities with rest pain, bilateral legs: Secondary | ICD-10-CM | POA: Diagnosis not present

## 2019-06-03 DIAGNOSIS — Z23 Encounter for immunization: Secondary | ICD-10-CM | POA: Diagnosis not present

## 2019-06-03 DIAGNOSIS — E875 Hyperkalemia: Secondary | ICD-10-CM | POA: Diagnosis not present

## 2019-06-03 DIAGNOSIS — J690 Pneumonitis due to inhalation of food and vomit: Secondary | ICD-10-CM | POA: Diagnosis not present

## 2019-06-03 DIAGNOSIS — B9689 Other specified bacterial agents as the cause of diseases classified elsewhere: Secondary | ICD-10-CM | POA: Diagnosis not present

## 2019-06-03 DIAGNOSIS — S91302A Unspecified open wound, left foot, initial encounter: Secondary | ICD-10-CM | POA: Diagnosis not present

## 2019-06-03 DIAGNOSIS — L03116 Cellulitis of left lower limb: Secondary | ICD-10-CM | POA: Diagnosis not present

## 2019-06-03 DIAGNOSIS — S51812A Laceration without foreign body of left forearm, initial encounter: Secondary | ICD-10-CM | POA: Diagnosis not present

## 2019-06-05 DIAGNOSIS — M79662 Pain in left lower leg: Secondary | ICD-10-CM | POA: Diagnosis not present

## 2019-06-06 ENCOUNTER — Other Ambulatory Visit (HOSPITAL_COMMUNITY): Payer: Self-pay | Admitting: Interventional Radiology

## 2019-06-06 DIAGNOSIS — I998 Other disorder of circulatory system: Secondary | ICD-10-CM

## 2019-06-06 DIAGNOSIS — I70229 Atherosclerosis of native arteries of extremities with rest pain, unspecified extremity: Secondary | ICD-10-CM

## 2019-06-07 ENCOUNTER — Inpatient Hospital Stay (HOSPITAL_COMMUNITY)
Admission: AD | Admit: 2019-06-07 | Discharge: 2019-06-13 | DRG: 270 | Disposition: A | Discharge status: Skilled Nursing Facility | Payer: PPO | Source: Other Acute Inpatient Hospital | Attending: Internal Medicine | Admitting: Internal Medicine

## 2019-06-07 DIAGNOSIS — L03115 Cellulitis of right lower limb: Secondary | ICD-10-CM | POA: Diagnosis not present

## 2019-06-07 DIAGNOSIS — R293 Abnormal posture: Secondary | ICD-10-CM | POA: Diagnosis not present

## 2019-06-07 DIAGNOSIS — S51812A Laceration without foreign body of left forearm, initial encounter: Secondary | ICD-10-CM | POA: Diagnosis not present

## 2019-06-07 DIAGNOSIS — E039 Hypothyroidism, unspecified: Secondary | ICD-10-CM | POA: Diagnosis not present

## 2019-06-07 DIAGNOSIS — I70222 Atherosclerosis of native arteries of extremities with rest pain, left leg: Secondary | ICD-10-CM | POA: Diagnosis not present

## 2019-06-07 DIAGNOSIS — E875 Hyperkalemia: Secondary | ICD-10-CM | POA: Diagnosis not present

## 2019-06-07 DIAGNOSIS — I70229 Atherosclerosis of native arteries of extremities with rest pain, unspecified extremity: Secondary | ICD-10-CM | POA: Diagnosis present

## 2019-06-07 DIAGNOSIS — L97919 Non-pressure chronic ulcer of unspecified part of right lower leg with unspecified severity: Secondary | ICD-10-CM | POA: Diagnosis not present

## 2019-06-07 DIAGNOSIS — I739 Peripheral vascular disease, unspecified: Secondary | ICD-10-CM | POA: Diagnosis present

## 2019-06-07 DIAGNOSIS — K5900 Constipation, unspecified: Secondary | ICD-10-CM | POA: Diagnosis not present

## 2019-06-07 DIAGNOSIS — R627 Adult failure to thrive: Secondary | ICD-10-CM

## 2019-06-07 DIAGNOSIS — D509 Iron deficiency anemia, unspecified: Secondary | ICD-10-CM | POA: Diagnosis present

## 2019-06-07 DIAGNOSIS — L97829 Non-pressure chronic ulcer of other part of left lower leg with unspecified severity: Secondary | ICD-10-CM | POA: Diagnosis not present

## 2019-06-07 DIAGNOSIS — E860 Dehydration: Secondary | ICD-10-CM | POA: Diagnosis not present

## 2019-06-07 DIAGNOSIS — Z20828 Contact with and (suspected) exposure to other viral communicable diseases: Secondary | ICD-10-CM | POA: Diagnosis not present

## 2019-06-07 DIAGNOSIS — I7092 Chronic total occlusion of artery of the extremities: Secondary | ICD-10-CM | POA: Diagnosis not present

## 2019-06-07 DIAGNOSIS — Z681 Body mass index (BMI) 19 or less, adult: Secondary | ICD-10-CM

## 2019-06-07 DIAGNOSIS — Z23 Encounter for immunization: Secondary | ICD-10-CM | POA: Diagnosis not present

## 2019-06-07 DIAGNOSIS — I998 Other disorder of circulatory system: Secondary | ICD-10-CM | POA: Diagnosis present

## 2019-06-07 DIAGNOSIS — R319 Hematuria, unspecified: Secondary | ICD-10-CM | POA: Diagnosis not present

## 2019-06-07 DIAGNOSIS — R269 Unspecified abnormalities of gait and mobility: Secondary | ICD-10-CM | POA: Diagnosis not present

## 2019-06-07 DIAGNOSIS — J690 Pneumonitis due to inhalation of food and vomit: Secondary | ICD-10-CM | POA: Diagnosis not present

## 2019-06-07 DIAGNOSIS — I499 Cardiac arrhythmia, unspecified: Secondary | ICD-10-CM | POA: Diagnosis not present

## 2019-06-07 DIAGNOSIS — M47812 Spondylosis without myelopathy or radiculopathy, cervical region: Secondary | ICD-10-CM | POA: Diagnosis not present

## 2019-06-07 DIAGNOSIS — I959 Hypotension, unspecified: Secondary | ICD-10-CM | POA: Diagnosis not present

## 2019-06-07 DIAGNOSIS — Z6822 Body mass index (BMI) 22.0-22.9, adult: Secondary | ICD-10-CM | POA: Diagnosis not present

## 2019-06-07 DIAGNOSIS — E43 Unspecified severe protein-calorie malnutrition: Secondary | ICD-10-CM | POA: Diagnosis not present

## 2019-06-07 DIAGNOSIS — Z79899 Other long term (current) drug therapy: Secondary | ICD-10-CM | POA: Diagnosis not present

## 2019-06-07 DIAGNOSIS — L03116 Cellulitis of left lower limb: Secondary | ICD-10-CM | POA: Diagnosis present

## 2019-06-07 DIAGNOSIS — I70213 Atherosclerosis of native arteries of extremities with intermittent claudication, bilateral legs: Secondary | ICD-10-CM | POA: Diagnosis not present

## 2019-06-07 DIAGNOSIS — L0390 Cellulitis, unspecified: Secondary | ICD-10-CM | POA: Diagnosis not present

## 2019-06-07 DIAGNOSIS — R6251 Failure to thrive (child): Secondary | ICD-10-CM | POA: Diagnosis present

## 2019-06-07 DIAGNOSIS — D649 Anemia, unspecified: Secondary | ICD-10-CM | POA: Diagnosis present

## 2019-06-07 DIAGNOSIS — K4090 Unilateral inguinal hernia, without obstruction or gangrene, not specified as recurrent: Secondary | ICD-10-CM | POA: Diagnosis not present

## 2019-06-07 DIAGNOSIS — M2550 Pain in unspecified joint: Secondary | ICD-10-CM | POA: Diagnosis not present

## 2019-06-07 DIAGNOSIS — R279 Unspecified lack of coordination: Secondary | ICD-10-CM | POA: Diagnosis not present

## 2019-06-07 DIAGNOSIS — R531 Weakness: Secondary | ICD-10-CM | POA: Diagnosis not present

## 2019-06-07 DIAGNOSIS — R278 Other lack of coordination: Secondary | ICD-10-CM | POA: Diagnosis not present

## 2019-06-07 DIAGNOSIS — Z7401 Bed confinement status: Secondary | ICD-10-CM | POA: Diagnosis not present

## 2019-06-07 DIAGNOSIS — R600 Localized edema: Secondary | ICD-10-CM | POA: Diagnosis not present

## 2019-06-07 DIAGNOSIS — R262 Difficulty in walking, not elsewhere classified: Secondary | ICD-10-CM | POA: Diagnosis not present

## 2019-06-07 DIAGNOSIS — Z741 Need for assistance with personal care: Secondary | ICD-10-CM | POA: Diagnosis not present

## 2019-06-07 DIAGNOSIS — M6281 Muscle weakness (generalized): Secondary | ICD-10-CM | POA: Diagnosis not present

## 2019-06-07 LAB — CBC
HCT: 28.9 % — ABNORMAL LOW (ref 39.0–52.0)
Hemoglobin: 9.6 g/dL — ABNORMAL LOW (ref 13.0–17.0)
MCH: 30.8 pg (ref 26.0–34.0)
MCHC: 33.2 g/dL (ref 30.0–36.0)
MCV: 92.6 fL (ref 80.0–100.0)
Platelets: 244 10*3/uL (ref 150–400)
RBC: 3.12 MIL/uL — ABNORMAL LOW (ref 4.22–5.81)
RDW: 14 % (ref 11.5–15.5)
WBC: 4.7 10*3/uL (ref 4.0–10.5)
nRBC: 0 % (ref 0.0–0.2)

## 2019-06-07 LAB — HEPARIN LEVEL (UNFRACTIONATED): Heparin Unfractionated: 0.12 IU/mL — ABNORMAL LOW (ref 0.30–0.70)

## 2019-06-07 LAB — BASIC METABOLIC PANEL
Anion gap: 7 (ref 5–15)
BUN: 11 mg/dL (ref 8–23)
CO2: 23 mmol/L (ref 22–32)
Calcium: 8 mg/dL — ABNORMAL LOW (ref 8.9–10.3)
Chloride: 105 mmol/L (ref 98–111)
Creatinine, Ser: 0.68 mg/dL (ref 0.61–1.24)
GFR calc Af Amer: >60 mL/min (ref >60)
GFR calc non Af Amer: >60 mL/min (ref >60)
Glucose, Bld: 104 mg/dL — ABNORMAL HIGH (ref 70–99)
Potassium: 4.1 mmol/L (ref 3.5–5.1)
Sodium: 135 mmol/L (ref 135–145)

## 2019-06-07 MED ORDER — TRAMADOL HCL 50 MG PO TABS
50.0000 mg | ORAL_TABLET | Freq: Four times a day (QID) | ORAL | Status: DC | PRN
  Administered 2019-06-07 – 2019-06-13 (×11): 50 mg via ORAL
  Filled 2019-06-07 (×11): qty 1

## 2019-06-07 MED ORDER — LEVOTHYROXINE SODIUM 50 MCG PO TABS
50.0000 ug | ORAL_TABLET | Freq: Every day | ORAL | Status: DC
  Administered 2019-06-08 – 2019-06-13 (×5): 50 ug via ORAL
  Filled 2019-06-07 (×6): qty 1

## 2019-06-07 MED ORDER — CEFAZOLIN SODIUM-DEXTROSE 1-4 GM/50ML-% IV SOLN: 1.0000 g | Freq: Three times a day (TID) | INTRAVENOUS | Status: DC

## 2019-06-07 MED ORDER — SENNOSIDES-DOCUSATE SODIUM 8.6-50 MG PO TABS
1.0000 | ORAL_TABLET | Freq: Every day | ORAL | Status: DC
  Administered 2019-06-07 – 2019-06-12 (×6): 1 via ORAL
  Filled 2019-06-07 (×6): qty 1

## 2019-06-07 MED ORDER — FERROUS SULFATE 325 (65 FE) MG PO TABS
325.0000 mg | ORAL_TABLET | Freq: Every day | ORAL | Status: DC
  Administered 2019-06-08 – 2019-06-13 (×5): 325 mg via ORAL
  Filled 2019-06-07 (×5): qty 1

## 2019-06-07 MED ORDER — HEPARIN (PORCINE) 25000 UT/250ML-% IV SOLN
1050.0000 [IU]/h | INTRAVENOUS | Status: DC
  Administered 2019-06-08: 1050 [IU]/h via INTRAVENOUS
  Filled 2019-06-07: qty 250

## 2019-06-07 MED ORDER — AMOXICILLIN-POT CLAVULANATE 875-125 MG PO TABS
1.0000 | ORAL_TABLET | Freq: Two times a day (BID) | ORAL | Status: DC
  Administered 2019-06-07 – 2019-06-08 (×2): 1 via ORAL
  Filled 2019-06-07 (×2): qty 1

## 2019-06-07 NOTE — H&P (Signed)
History and Physical    Antonio Estes. [id number] DOB: 02/04/1927 DOA: 06/07/2019  PCP: Imagene Riches, NP  Patient coming from: [facility] , lives alone at home  I have personally briefly reviewed patient's old medical records in [place]  Chief Complaint: Transfer from [facility] for critical limb ischemia  HPI: Antonio Estes. is a 85 y.o. male with medical history significant of peripheral vascular disease, chronic bilateral lower extremity wounds followed by home health and outpatient wound care center who presented to ED at [facility] on 11/13 following a fall 2 days prior due to generalized weakness. No other injuries except for superficial skin laceration of the left forearm. Denies any loss of consciousness or head injury from the fall.  He was subsequently found to have left lower extremity ischemia. ABI shows resting ABI of the left extremity in the moderate range arterial occlusive disease of the femoropopliteal segment and tibial arteries.  CTA aortic with femoral runoff shows several areas of progressive disease. Pt was evaluated by VIR Dr. Corrie Estes at [place] who recommended transfer to [facility] for tentative aorta peripheral angiogram and possible treatment of left lower extremity. Anticoagulation with heparin was started.  He was started on Augmentin  for his leg wounds and also for coverage of incidential findings of aspiration pneumonia at the right costophrenic angle on CTA.    Review of Systems:  Constitutional: No Weight Change, No Fever ENT/Mouth: No sore throat, No Rhinorrhea Eyes: No Eye Pain, No Vision Changes Cardiovascular: No Chest Pain, no SOB Respiratory: No Cough, No Sputum, No Wheezing, no Dyspnea  Gastrointestinal: No Nausea, No Vomiting, No Diarrhea, No Constipation, No Pain Genitourinary: no Urinary Incontinence, No Urgency, No Flank Pain Musculoskeletal: No Arthralgias, No Myalgias Skin: No Skin  Lesions, No Pruritus, Neuro: no Weakness, No Numbness,  No Loss of Consciousness, No Syncope Psych: No Anxiety/Panic, No Depression, no decrease appetite Heme/Lymph: No Bruising, No Bleeding    Prior to Admission medications   Not on File    Physical Exam: Vitals:   06/07/19 2039  Weight: 63.9 kg  Height: 6\' 5"  (1.956 m)    Constitutional: NAD, calm, comfortable,non-toxic appearing thin elderly gentleman laying in bed Vitals:   06/07/19 2039  Weight: 63.9 kg  Height: 6\' 5"  (1.956 m)   Eyes: PERRL, lids and conjunctivae normal ENMT: Mucous membranes are moist. Posterior pharynx clear of any exudate or lesions. Neck: normal, supple, no masses, Respiratory: clear to auscultation bilaterally, no wheezing, no crackles. Normal respiratory effort. No accessory muscle use.  Cardiovascular: Regular rate and rhythm, no murmurs / rubs / gallops. No extremity edema. Weak 2+ pedal pulses. No carotid bruits.  Abdomen: no tenderness, no masses palpated.  Bowel sounds positive.  Musculoskeletal: no clubbing / cyanosis. No joint deformity upper and lower extremities. Good ROM, no contractures. Normal muscle tone. Both lower extremity is warm to the touch.  Skin: Dry brown scaly skin of  bilateral feet and pre-tibial region of bilateral lower extremity.  Bilateral lower extremity pre-tibial region was wrapped in clean bandages without any blood or drainage.  Patient had numerous superficial ulcer scattered on the pretibial region on the left and right lower extremity with mild erythema.  All digits of the toes had brown discoloration with brittle nails. There is a healing 2 inch laceration on the left forearm of the upper extremity with healing eschar and mild surrounding erythema wrapped in clean bandage. Neurologic: CN 2-12 grossly intact.  Notes decreased sensation  of the left foot compared to the right.  Strength 5/5 in all 4.  Psychiatric: Normal judgment and insight. Alert and oriented x 3.  Normal mood.     Labs on Admission: I have personally reviewed following labs and imaging studies  CBC: Recent Labs  Lab 06/07/19 2032  WBC 4.7  HGB 9.6*  HCT 28.9*  MCV 92.6  PLT [id number]   Basic Metabolic Panel: No results for input(s): NA, K, CL, CO2, GLUCOSE, BUN, CREATININE, CALCIUM, MG, PHOS in the last 168 hours. GFR: CrCl cannot be calculated (No successful lab value found.). Liver Function Tests: No results for input(s): AST, ALT, ALKPHOS, BILITOT, PROT, ALBUMIN in the last 168 hours. No results for input(s): LIPASE, AMYLASE in the last 168 hours. No results for input(s): AMMONIA in the last 168 hours. Coagulation Profile: No results for input(s): INR, PROTIME in the last 168 hours. Cardiac Enzymes: No results for input(s): CKTOTAL, CKMB, CKMBINDEX, TROPONINI in the last 168 hours. BNP (last 3 results) No results for input(s): PROBNP in the last 8760 hours. HbA1C: No results for input(s): HGBA1C in the last 72 hours. CBG: No results for input(s): GLUCAP in the last 168 hours. Lipid Profile: No results for input(s): CHOL, HDL, LDLCALC, TRIG, CHOLHDL, LDLDIRECT in the last 72 hours. Thyroid Function Tests: No results for input(s): TSH, T4TOTAL, FREET4, T3FREE, THYROIDAB in the last 72 hours. Anemia Panel: No results for input(s): VITAMINB12, FOLATE, FERRITIN, TIBC, IRON, RETICCTPCT in the last 72 hours. Urine analysis: No results found for: COLORURINE, APPEARANCEUR, LABSPEC, PHURINE, GLUCOSEU, HGBUR, BILIRUBINUR, KETONESUR, PROTEINUR, UROBILINOGEN, NITRITE, LEUKOCYTESUR  Radiological Exams on Admission: No results found.  [facility] results from [facility]  Diagnostic Imaging Report  IMPRESSION: Right:  Resting ABI within normal limits though this may be artificially elevated. Segmental exam demonstrates distal femoropopliteal disease and tibial occlusive disease.  Left:  Resting ABI in the moderate range arterial occlusive disease.  Segmental exam  demonstrates occlusive disease of the femoropopliteal segment and the tibial arteries.  Signed,  Dulcy Fanny. Dellia Nims, RPVI  [facility]  [facility]   Electronically Signed   By: Antonio Estes D.O.   On: 06/05/2019 15:54   IMPRESSION: VASCULAR  1. Large amount of atherosclerotic plaque within a normal caliber abdominal aorta. Aortic Atherosclerosis (ICD10-I70.0).  Right lower extremity vascular impression:  1. No evidence of a hemodynamically significant stenosis affecting the inflow arterial vasculature of the right lower extremity. 2. Suspected tandem areas of hemodynamically significant narrowing throughout the right superficial artery including suspected 75% narrowing proximally, progressed compared to the 02/2015 examination. 3. At least 50% luminal narrowing involving the right above knee popliteal artery, progressed compared to the 2016 examination. 4. Predominant arterial supply to the right lower leg is via the right peroneal artery which reconstitutes both the right anterior and posterior tibial vascular distribution at the level of the ankle mortise. The right-sided dorsalis pedis artery is patent to the level of the midfoot  Left lower extremity vascular impression:  1. No evidence of a hemodynamically significant stenosis involving the inflow arterial vasculature of the left lower extremity. 2. Suspected tandem areas of hemodynamically significant narrowing throughout the left superficial femoral artery including a suspected 75% luminal narrowing involving the proximal aspect left SFA, likely progressed compared to the 02/2015 examination. 3. Scattered occlusion of all 3 major runoff vessels of the left lower leg. While occluded proximally with early reconstitution, the left peroneal artery serves as the predominant arterial supply to the  left lower leg and reconstitutes both the left posterior tibial and  anterior tibial arterial vascular distributions at the level of the ankle mortise, similar to the 2016 examination. The left-sided dorsalis pedis artery is patent to the level forefoot.  NON-VASCULAR  1. Tree-in-bud opacities within the right costophrenic angle could be seen in the setting of aspiration and/or atypical infection. Clinical correlation. 2. Short-segment nondilated loop of small bowel is contained within a left-sided indirect inguinal hernia, not resulting in enteric Obstruction.  Assessment/Plan  Acute ischemia superimposed on peripheral vascular disease with chronic bilateral lower extremity wounds - Pt was given IV Unasyn in ED x 1 on 11/13 and continued on Augmentin given suspicion of early onset celluitis of LE wounds. Will continue Augmentin.  - VIR consulted for possible aorta peripheral angiogram. Will keep NPO past midnight. - continue heparin gtt - wound care per RN. Continue monitoring with hand-held doppler per shift.  - continue Tramadol PRN for pain  Failure to thrive/Fall - pt lives alone and is concerned about his progressive weakness - PT/OT  -Consult dietitian  Questionable aspiration pneumonia - incidential finding on CTA aorta but pt had had stable O2 sat and is afebrile here. He was treated with Augmentin at outside facility.  - will consult speech therapy - will not start antibiotics for this at this time  Hyperkalemia -pt reportedly had mild elavation at outside facilty that was resolved with fluid - will recheck BMP now  Normocytic anemia -Normal Vitamin 123456 and folic. Iron panel suggestive of possible iron-deficiency anemia - will continue iron supplement started by outside facility - will obtain FOBT - check CBC  Hypothyroidism -Patient reportedly does not take his levothyroxine regularly. -will continue low-dose levothyroxine  Left indirect inguinal hernia -Incidental finding on CTA.  No signs of bowel incarceration. No abdominal  complaints  Pt had Lasix also in discharge medication but unsure why. Will hold for now.   DVT prophylaxis:Heparin gtt Code Status:Full Family Communication: Plan discussed with patient at bedside  disposition Plan: Home with at least 2 midnight stays  Consults called: VIR Admission status: inpatient   Mann Skaggs T Pascuala Klutts DO [facility]   If 7PM-7AM, please contact night-coverage [email or web address] Password [facility]  06/07/2019, 8:52 PM

## 2019-06-07 NOTE — Progress Notes (Signed)
ANTICOAGULATION CONSULT NOTE - Initial Consult  Pharmacy Consult for heparin Indication: Limb ischemia   Not on File  Patient Measurements: Height: 6\' 5"  (195.6 cm) Weight: 140 lb 14 oz (63.9 kg) IBW/kg (Calculated) : 89.1 Heparin Dosing Weight: TBW  Vital Signs:    Labs: Recent Labs    06/07/19 2032  HGB 9.6*  HCT 28.9*  PLT 244    CrCl cannot be calculated (No successful lab value found.).   Medical History: No past medical history on file.  Assessment: 66 YOM as transfer from [facility] on heparin gtt for left lower extremity ischemia.  Heparin gtt currently running at 900 units/hr.  Initial heparin level subtherapeutic and no report of stoppages on transfer.  Goal of Therapy:  Heparin level 0.3-0.7 units/ml Monitor platelets by anticoagulation protocol: Yes   Plan:  Increase heparin gtt to 1050 units/hr Heparin level in 8 hours  Bertis Ruddy, PharmD Clinical Pharmacist Please check AMION for all [place] numbers 06/07/2019 9:22 PM

## 2019-06-08 DIAGNOSIS — I998 Other disorder of circulatory system: Secondary | ICD-10-CM

## 2019-06-08 LAB — HEMOGLOBIN A1C
Hgb A1c MFr Bld: 5.5 % (ref 4.8–5.6)
Mean Plasma Glucose: 111.15 mg/dL

## 2019-06-08 LAB — BASIC METABOLIC PANEL
Anion gap: 11 (ref 5–15)
BUN: 9 mg/dL (ref 8–23)
CO2: 24 mmol/L (ref 22–32)
Calcium: 8.1 mg/dL — ABNORMAL LOW (ref 8.9–10.3)
Chloride: 102 mmol/L (ref 98–111)
Creatinine, Ser: 0.77 mg/dL (ref 0.61–1.24)
GFR calc Af Amer: >60 mL/min (ref >60)
GFR calc non Af Amer: >60 mL/min (ref >60)
Glucose, Bld: 88 mg/dL (ref 70–99)
Potassium: 3.8 mmol/L (ref 3.5–5.1)
Sodium: 137 mmol/L (ref 135–145)

## 2019-06-08 LAB — SEDIMENTATION RATE: Sed Rate: 53 mm/hr — ABNORMAL HIGH (ref 0–16)

## 2019-06-08 LAB — HEPARIN LEVEL (UNFRACTIONATED)
Heparin Unfractionated: 0.12 IU/mL — ABNORMAL LOW (ref 0.30–0.70)
Heparin Unfractionated: 0.48 IU/mL (ref 0.30–0.70)

## 2019-06-08 LAB — CBC
HCT: 28.4 % — ABNORMAL LOW (ref 39.0–52.0)
Hemoglobin: 9.1 g/dL — ABNORMAL LOW (ref 13.0–17.0)
MCH: 30.1 pg (ref 26.0–34.0)
MCHC: 32 g/dL (ref 30.0–36.0)
MCV: 94 fL (ref 80.0–100.0)
Platelets: 257 10*3/uL (ref 150–400)
RBC: 3.02 MIL/uL — ABNORMAL LOW (ref 4.22–5.81)
RDW: 14.2 % (ref 11.5–15.5)
WBC: 4.7 10*3/uL (ref 4.0–10.5)
nRBC: 0 % (ref 0.0–0.2)

## 2019-06-08 LAB — PROTIME-INR
INR: 1.1 (ref 0.8–1.2)
Prothrombin Time: 13.6 seconds (ref 11.4–15.2)

## 2019-06-08 LAB — PREALBUMIN: Prealbumin: 13.2 mg/dL — ABNORMAL LOW (ref 18–38)

## 2019-06-08 LAB — C-REACTIVE PROTEIN: CRP: 3.9 mg/dL — ABNORMAL HIGH (ref <1.0)

## 2019-06-08 MED ORDER — COLLAGENASE 250 UNIT/GM EX OINT
TOPICAL_OINTMENT | Freq: Every day | CUTANEOUS | Status: DC
  Administered 2019-06-08 – 2019-06-12 (×5): via TOPICAL
  Filled 2019-06-08: qty 30

## 2019-06-08 MED ORDER — PNEUMOCOCCAL VAC POLYVALENT 25 MCG/0.5ML IJ INJ: 0.5000 mL | INJECTION | INTRAMUSCULAR | Status: DC | PRN

## 2019-06-08 MED ORDER — SODIUM CHLORIDE 0.9 % IV SOLN
2.0000 g | INTRAVENOUS | Status: DC
  Administered 2019-06-08 – 2019-06-11 (×4): 2 g via INTRAVENOUS
  Filled 2019-06-08 (×4): qty 2

## 2019-06-08 MED ORDER — ASPIRIN 325 MG PO TABS
325.0000 mg | ORAL_TABLET | Freq: Once | ORAL | Status: AC
  Administered 2019-06-09: 325 mg via ORAL
  Filled 2019-06-08: qty 1

## 2019-06-08 MED ORDER — VANCOMYCIN HCL 10 G IV SOLR
1500.0000 mg | Freq: Once | INTRAVENOUS | Status: AC
  Administered 2019-06-08: 1500 mg via INTRAVENOUS
  Filled 2019-06-08: qty 1500

## 2019-06-08 MED ORDER — SODIUM CHLORIDE 0.9 % IV SOLN
INTRAVENOUS | Status: DC | PRN
  Administered 2019-06-10: 1000 mL via INTRAVENOUS

## 2019-06-08 MED ORDER — HEPARIN (PORCINE) 25000 UT/250ML-% IV SOLN
1250.0000 [IU]/h | INTRAVENOUS | Status: DC
  Filled 2019-06-08 (×2): qty 250

## 2019-06-08 MED ORDER — VANCOMYCIN HCL 10 G IV SOLR
1250.0000 mg | INTRAVENOUS | Status: DC
  Administered 2019-06-09 – 2019-06-10 (×2): 1250 mg via INTRAVENOUS
  Filled 2019-06-08 (×3): qty 1250

## 2019-06-08 MED ORDER — CLOPIDOGREL BISULFATE 75 MG PO TABS
300.0000 mg | ORAL_TABLET | Freq: Once | ORAL | Status: AC
  Administered 2019-06-09: 300 mg via ORAL
  Filled 2019-06-08: qty 4

## 2019-06-08 MED ORDER — MORPHINE SULFATE (PF) 2 MG/ML IV SOLN
0.5000 mg | INTRAVENOUS | Status: DC | PRN
  Administered 2019-06-08 – 2019-06-11 (×4): 1 mg via INTRAVENOUS
  Filled 2019-06-08 (×4): qty 1

## 2019-06-08 MED ORDER — ENSURE ENLIVE PO LIQD
237.0000 mL | Freq: Two times a day (BID) | ORAL | Status: DC
  Administered 2019-06-12 (×2): 237 mL via ORAL

## 2019-06-08 MED ORDER — METRONIDAZOLE IN NACL 5-0.79 MG/ML-% IV SOLN
500.0000 mg | Freq: Three times a day (TID) | INTRAVENOUS | Status: DC
  Administered 2019-06-08 – 2019-06-11 (×10): 500 mg via INTRAVENOUS
  Filled 2019-06-08 (×10): qty 100

## 2019-06-08 MED ORDER — ADULT MULTIVITAMIN W/MINERALS CH
1.0000 | ORAL_TABLET | Freq: Every day | ORAL | Status: DC
  Administered 2019-06-09 – 2019-06-13 (×5): 1 via ORAL
  Filled 2019-06-08 (×5): qty 1

## 2019-06-08 NOTE — Consult Note (Signed)
Chief Complaint: Patient was seen in consultation today for Aortoperipheral angiogram with possible intervention left leg at the request of R York NP and Dr Hayes Ludwig   Supervising Physician: Corrie Mckusick  Patient Status: [facility] - In-pt  History of Present Illness: Antonio Estes. is a 85 y.o. male   Dr Earleen Newport consult note 06/05/19: Antonio Estes is 85 year old male with left Rutherford 4 class symptoms of critical limb ischemia, Rutherford 3 class symptoms of PAD, with superimposed bilateral chronic venous insufficiency, likely bilateral CEAP 6, exacerbated by the arterial insufficiency. I had an extensive discussion with him regarding peripheral arterial disease/cli, natural history, anatomy, physiology/ pathophysiology, and treatment options. As a primary therapy, I emphasized continuing Medical Care with modifying risk factors as the foundation for treatment. Regarding possible procedural treatment, I discussed with him surgical options versus endovascular options, with the majority of our discussion on endovascular options, as he is likely a poor candidate for a formal bypass.. I believe, on the other hand, he is a candidate for angiogram and possible endovascular treatment. Goals of therapy would be improving his perfusion of the lower extremity to relieve the rest pain, and potentially gain some healing of his existing venous changes. I did have a specific, say shin regarding the implications of a diagnosis of critical limb ischemia/chronic limb threatening ischemia, specifically the 25% mortality at 1 year, as well as the high rate of limb amputation. I discussed with him specific risks of endovascular approach, including bleeding, infection, kidney injury, contrast reaction, need for further procedure/ surgery, arterial injury/dissection, limb loss, cardiopulmonary collapse, death. I discussed with him our goals of restoring blood flow to left lower extremity.  After our discussion he does wish to proceed with treatment. This will require transferred to [facility] for further care. I discussed his case and our plan with his attending physician, Dr. Hayes Ludwig. Tentative plan will be for a transfer within the next day or so for a case later this week.  IMPRESSION: Doppler 06/05/19 Right: Resting ABI within normal limits though this may be artificially elevated. Segmental exam demonstrates distal femoropopliteal disease and tibial occlusive disease. Left: Resting ABI in the moderate range arterial occlusive disease. Segmental exam demonstrates occlusive disease of the femoropopliteal segment and the tibial arteries.  Pt now IP [facility] Scheduled tomorrow for Aortoperipheral angiogram with possible intervention     Allergies: Patient has no allergy information on record.  Medications: Prior to Admission medications   Not on File     No family history on file.  Social History   Socioeconomic History  . Marital status: Widowed    Spouse name: Not on file  . Number of children: Not on file  . Years of education: Not on file  . Highest education level: Not on file  Occupational History  . Not on file  Social Needs  . Financial resource strain: Not on file  . Food insecurity    Worry: Not on file    Inability: Not on file  . Transportation needs    Medical: Not on file    Non-medical: Not on file  Tobacco Use  . Smoking status: Not on file  Substance and Sexual Activity  . Alcohol use: Not on file  . Drug use: Not on file  . Sexual activity: Not on file  Lifestyle  . Physical activity    Days per week: Not on file    Minutes per session: Not on file  . Stress: Not on  file  Relationships  . Social Herbalist on phone: Not on file    Gets together: Not on file    Attends religious service: Not on file    Active member of club or organization: Not on file    Attends meetings of clubs or organizations:  Not on file    Relationship status: Not on file  Other Topics Concern  . Not on file  Social History Narrative  . Not on file     Review of Systems: A 12 point ROS discussed and pertinent positives are indicated in the HPI above.  All other systems are negative.  Review of Systems  Constitutional: Positive for activity change and fatigue. Negative for fever.  Respiratory: Negative for cough and shortness of breath.   Cardiovascular: Negative for chest pain.  Gastrointestinal: Negative for abdominal pain.  Musculoskeletal: Positive for gait problem.  Skin: Positive for color change and wound.  Neurological: Negative for weakness.  Psychiatric/Behavioral: Negative for behavioral problems and confusion.    Vital Signs: BP (!) 101/46 (BP Location: Right Arm)   Pulse 61   Temp (!) 97.5 F (36.4 C) (Oral)   Resp 15   Ht 6\' 5"  (1.956 m)   Wt 140 lb 14 oz (63.9 kg)   SpO2 97%   BMI 16.71 kg/m   Physical Exam Vitals signs reviewed.  HENT:     Mouth/Throat:     Mouth: Mucous membranes are moist.  Cardiovascular:     Rate and Rhythm: Normal rate and regular rhythm.     Heart sounds: Normal heart sounds.  Pulmonary:     Breath sounds: Normal breath sounds.  Abdominal:     Palpations: Abdomen is soft.  Musculoskeletal:        General: Tenderness present.  Skin:    Findings: Erythema present.     Comments: Bilat lower extremities with wounds and redness Left worse than right  Neurological:     Mental Status: He is alert and oriented to person, place, and time.  Psychiatric:        Mood and Affect: Mood normal.        Behavior: Behavior normal.        Thought Content: Thought content normal.        Judgment: Judgment normal.     Imaging: No results found.  Labs:  CBC: Recent Labs    06/07/19 2032 06/08/19 0629  WBC 4.7 4.7  HGB 9.6* 9.1*  HCT 28.9* 28.4*  PLT 244 257    COAGS: Recent Labs    06/08/19 0629  INR 1.1    BMP: Recent Labs    06/07/19  2032 06/08/19 0629  NA 135 137  K 4.1 3.8  CL 105 102  CO2 23 24  GLUCOSE 104* 88  BUN 11 9  CALCIUM 8.0* 8.1*  CREATININE 0.68 0.77  GFRNONAA >60 >60  GFRAA >60 >60    LIVER FUNCTION TESTS: No results for input(s): BILITOT, AST, ALT, ALKPHOS, PROT, ALBUMIN in the last 8760 hours.  TUMOR MARKERS: No results for input(s): AFPTM, CEA, CA199, CHROMGRNA in the last 8760 hours.  Assessment and Plan:  Left lower extremity Peripheral arterial disease Painful Abnormal doppler studies Consult with Dr Earleen Newport 11/16 Now scheduled for Aortopherpheral angiogram with possible intervention - left leg Risks and benefits of Aortoperipheral angiogram with intervention of left leg were discussed with the patient including, but not limited to bleeding, infection, vascular injury or contrast induced renal failure.  This interventional procedure involves the use of X-rays and because of the nature of the planned procedure, it is possible that we will have prolonged use of X-ray fluoroscopy.  Potential radiation risks to you include (but are not limited to) the following: - A slightly elevated risk for cancer  several years later in life. This risk is typically less than 0.5% percent. This risk is low in comparison to the normal incidence of human cancer, which is 33% for women and 50% for men according to the [place]. - Radiation induced injury can include skin redness, resembling a rash, tissue breakdown / ulcers and hair loss (which can be temporary or permanent).   The likelihood of either of these occurring depends on the difficulty of the procedure and whether you are sensitive to radiation due to previous procedures, disease, or genetic conditions.   IF your procedure requires a prolonged use of radiation, you will be notified and given written instructions for further action.  It is your responsibility to monitor the irradiated area for the 2 weeks following the procedure and  to notify your physician if you are concerned that you have suffered a radiation induced injury.    All of the patient's questions were answered, patient is agreeable to proceed.  Consent signed and in chart.  Thank you for this interesting consult.  I greatly enjoyed meeting Antonio Estes. and look forward to participating in their care.  A copy of this report was sent to the requesting provider on this date.  Electronically Signed: Lavonia Drafts, PA-C 06/08/2019, 10:06 AM   I spent a total of 40 Minutes    in face to face in clinical consultation, greater than 50% of which was counseling/coordinating care for Aortoperipheral arteriogram

## 2019-06-08 NOTE — Evaluation (Signed)
Physical Therapy Evaluation Patient Details Name: Christerpher Kishi. MRN: [id number] DOB: 1927-02-04 Today's Date: 06/08/2019   History of Present Illness  Pt is a 85 y/o male admitted secondary to generalized weakness and falls at home. Pt was found to have L LE ischemia and incidental finding of aspiration pnuemonia. Plan is for tentative aorta peripheral angiogram and possible treatment of the L LE. PMH including but not limited to PVD and chronic bilateral LE wounds.    Clinical Impression  Pt presented supine in bed with HOB elevated, awake and willing to participate in therapy session. Prior to admission, pt reported that he ambulated with use of a cane and was independent with ADLs. Pt lives alone but is interested in d/c'ing to a SNF as he feels very weak and has been falling frequently at home. At the time of evaluation, pt greatly limited secondary to generalized weakness and fatigue. Pt able to perform bed mobility with min guard, transfers with min A x2 and was able to side step with RW and min guard for safety. Pt would continue to benefit from skilled physical therapy services at this time while admitted and after d/c to address the below listed limitations in order to improve overall safety and independence with functional mobility.     Follow Up Recommendations SNF    Equipment Recommendations  None recommended by PT    Recommendations for Other Services       Precautions / Restrictions Precautions Precautions: Fall Restrictions Weight Bearing Restrictions: No      Mobility  Bed Mobility Overal bed mobility: Needs Assistance Bed Mobility: Supine to Sit     Supine to sit: Min guard     General bed mobility comments: min guard for safety. Extra time and effort but no physical assist needed. Able to bridge hips to scoot along EOB.   Transfers Overall transfer level: Needs assistance Equipment used: Rolling walker (2 wheeled) Transfers: Sit to/from Stand Sit  to Stand: Min assist;+2 safety/equipment         General transfer comment: initial posterior LOB standing from EOB, assist to steady. cues for positioning prior to sitting in recliner. Pt reporting feeling very weak in standing.  Ambulation/Gait             General Gait Details: pt agreeable to take 4-5 side steps to recliner chair with min guard and RW; limiting himself as his meal had just arrived and he was very hungry from being NPO  Stairs            Wheelchair Mobility    Modified Rankin (Stroke Patients Only)       Balance Overall balance assessment: Needs assistance;History of Falls Sitting-balance support: Feet supported Sitting balance-Leahy Scale: Fair   Postural control: Posterior lean;Other (comment)(upon standing) Standing balance support: Bilateral upper extremity supported;During functional activity Standing balance-Leahy Scale: Poor Standing balance comment: rw and min A to steady, improved to rw and min guard.                             Pertinent Vitals/Pain Pain Assessment: Faces Faces Pain Scale: Hurts little more Pain Location: BLE Pain Descriptors / Indicators: Grimacing Pain Intervention(s): Monitored during session;Repositioned    Home Living Family/patient expects to be discharged to:: Skilled nursing facility Living Arrangements: Alone             Home Equipment: Kasandra Knudsen - single point;Walker - 2 wheels  Prior Function Level of Independence: Independent with assistive device(s)         Comments: cane>walker at baseline. Pt reports he sponge bathes at baseline. 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      Hand Dominance        Extremity/Trunk Assessment   Upper Extremity Assessment Upper Extremity Assessment: Generalized weakness;Defer to OT evaluation    Lower Extremity Assessment Lower Extremity Assessment:  Generalized weakness    Cervical / Trunk Assessment Cervical / Trunk Assessment: Kyphotic  Communication   Communication: No difficulties  Cognition Arousal/Alertness: Awake/alert Behavior During Therapy: Flat affect Overall Cognitive Status: Within Functional Limits for tasks assessed                                 General Comments: flat affect. taciturn.      General Comments      Exercises     Assessment/Plan    PT Assessment Patient needs continued PT services  PT Problem List Decreased strength;Decreased activity tolerance;Decreased balance;Decreased mobility;Decreased coordination;Decreased knowledge of use of DME;Decreased safety awareness;Decreased knowledge of precautions       PT Treatment Interventions DME instruction;Gait training;Stair training;Functional mobility training;Therapeutic activities;Therapeutic exercise;Balance training;Neuromuscular re-education;Patient/family education    PT Goals (Current goals can be found in the Care Plan section)  Acute Rehab PT Goals Patient Stated Goal: to get stronger PT Goal Formulation: With patient Time For Goal Achievement: 06/22/19 Potential to Achieve Goals: Good    Frequency Min 2X/week   Barriers to discharge        Co-evaluation PT/OT/SLP Co-Evaluation/Treatment: Yes Reason for Co-Treatment: To address functional/ADL transfers;For patient/therapist safety PT goals addressed during session: Mobility/safety with mobility;Balance;Proper use of DME;Strengthening/ROM OT goals addressed during session: ADL's and self-care       AM-PAC PT "6 Clicks" Mobility  Outcome Measure Help needed turning from your back to your side while in a flat bed without using bedrails?: None Help needed moving from lying on your back to sitting on the side of a flat bed without using bedrails?: None Help needed moving to and from a bed to a chair (including a wheelchair)?: A Little Help needed standing up from a  chair using your arms (e.g., wheelchair or bedside chair)?: A Little Help needed to walk in hospital room?: A Little Help needed climbing 3-5 steps with a railing? : A Lot 6 Click Score: 19    End of Session Equipment Utilized During Treatment: Gait belt Activity Tolerance: Patient tolerated treatment well Patient left: in chair;with call bell/phone within reach;with chair alarm set Nurse Communication: Mobility status PT Visit Diagnosis: Other abnormalities of gait and mobility (R26.89)    Time: [id number] PT Time Calculation (min) (ACUTE ONLY): 16 min   Charges:   PT Evaluation $PT Eval Moderate Complexity: 1 Mod          Eduard Clos, PT, DPT  Acute Rehabilitation Services Pager [phone] Office [place] 06/08/2019, 1:11 PM

## 2019-06-08 NOTE — Plan of Care (Signed)

## 2019-06-08 NOTE — Evaluation (Signed)
Occupational Therapy Evaluation Patient Details Name: Antonio Estes. MRN: [id number] DOB: [birth year] Today's Date: 06/08/2019    History of Present Illness 85 y.o. male with medical history significant of peripheral vascular disease, chronic bilateral lower extremity wounds followed by home health and outpatient wound care center who presented to ED at [facility] on 11/13 following a fall 2 days prior due to generalized weakness. He was subsequently found to have left lower extremity ischemia. Transferred to [facility] for tentative aorta peripheral angiogram and possible treatment of left lower extremity. Incidential finding of aspiration pnemonia.    Clinical Impression   Pt admitted with the above diagnoses and presents with below problem list. Pt will benefit from continued acute OT to address the below listed deficits and maximize independence with basic ADLs prior to d/c to venue below. PTA pt was from home alone, mod I with basic ADLs, sponge bathes in lieu of shower transfers. Pt currently min - mod A +2 for s/e with LB ADLs, setup - supervision with UB ADLs. Posterior LOB upon standing, min A +2 to steady. Session limited by arrival of meal tray. Pt requesting ST SNF for rehab at time of d/c as he is from home alone.       Follow Up Recommendations  SNF    Equipment Recommendations  Other (comment)(defer to next venue)    Recommendations for Other Services       Precautions / Restrictions Precautions Precautions: Fall Restrictions Weight Bearing Restrictions: No      Mobility Bed Mobility Overal bed mobility: Needs Assistance Bed Mobility: Supine to Sit     Supine to sit: Min guard     General bed mobility comments: min guard for safety. Extra time and effort but no physical assist needed. Able to bridge hips to scoot along EOB.   Transfers Overall transfer level: Needs assistance Equipment used: Rolling walker (2 wheeled) Transfers: Sit to/from  Stand Sit to Stand: Min assist;+2 safety/equipment         General transfer comment: initial posterior LOB standing from EOB, assist to steady. cues for positioning prior to sitting in recliner. Pt reporting feeling very weak in standing.    Balance Overall balance assessment: Needs assistance;History of Falls Sitting-balance support: Feet supported Sitting balance-Leahy Scale: Fair   Postural control: Posterior lean;Other (comment)(upon standing) Standing balance support: Bilateral upper extremity supported;During functional activity Standing balance-Leahy Scale: Poor Standing balance comment: rw and min A to steady, improved to rw and min guard.                           ADL either performed or assessed with clinical judgement   ADL Overall ADL's : Needs assistance/impaired Eating/Feeding: Set up;Sitting   Grooming: Set up;Supervision/safety;Sitting   Upper Body Bathing: Set up;Sitting   Lower Body Bathing: +2 for safety/equipment;Moderate assistance;Sit to/from stand   Upper Body Dressing : Set up;Supervision/safety;Sitting   Lower Body Dressing: Moderate assistance;+2 for safety/equipment   Toilet Transfer: Minimal assistance;+2 for safety/equipment;+2 for physical assistance Toilet Transfer Details (indicate cue type and reason): sidestepped to recliner. session limited by arrival of meal tray. Toileting- Clothing Manipulation and Hygiene: Minimal assistance;+2 for safety/equipment;Moderate assistance;Sit to/from stand   Tub/ Shower Transfer: Minimal assistance;+2 for safety/equipment;+2 for physical assistance   Functional mobility during ADLs: Minimal assistance;+2 for safety/equipment;Rolling walker(sidestepped to recliner to sit up for lunch) General ADL Comments: Pt completed bed mobility, sat EOB a few minutes then stood and sidestepped to  sit in recliner. Session limited by arrival of meal tray.      Vision Baseline Vision/History: Wears  glasses Wears Glasses: Reading only       Perception     Praxis      Pertinent Vitals/Pain Pain Assessment: Faces Faces Pain Scale: Hurts little more Pain Location: BLE Pain Descriptors / Indicators: Grimacing Pain Intervention(s): Monitored during session;Repositioned;RN gave pain meds during session     Hand Dominance     Extremity/Trunk Assessment Upper Extremity Assessment Upper Extremity Assessment: Generalized weakness;Overall [facility] for tasks assessed   Lower Extremity Assessment Lower Extremity Assessment: Defer to PT evaluation   Cervical / Trunk Assessment Cervical / Trunk Assessment: Kyphotic(significant neck flexed posture)   Communication Communication Communication: No difficulties   Cognition Arousal/Alertness: Awake/alert Behavior During Therapy: Flat affect Overall Cognitive Status: No family/caregiver present to determine baseline cognitive functioning                                 General Comments: flat affect. taciturn.   General Comments       Exercises     Shoulder Instructions      Home Living Family/patient expects to be discharged to:: Skilled nursing facility Living Arrangements: Alone                           Home Equipment: Kasandra Knudsen - single point;Walker - 2 wheels          Prior Functioning/Environment Level of Independence: Independent with assistive device(s)        Comments: cane>walker at baseline. Pt reports he sponge bathes at baseline. 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         OT Problem List: Decreased strength;Decreased activity tolerance;Impaired balance (sitting and/or standing);Decreased knowledge of use of DME or AE;Decreased knowledge of precautions;Pain      OT Treatment/Interventions: Self-care/ADL training;Therapeutic exercise;DME and/or AE instruction;Therapeutic  activities;Patient/family education;Balance training    OT Goals(Current goals can be found in the care plan section) Acute Rehab OT Goals Patient Stated Goal: ST SNF for rehab then hopeful to return home OT Goal Formulation: With patient Time For Goal Achievement: 06/22/19 Potential to Achieve Goals: Good ADL Goals Pt Will Perform Lower Body Bathing: sit to/from stand;with supervision Pt Will Perform Lower Body Dressing: with supervision;sit to/from stand Pt Will Transfer to Toilet: with supervision;ambulating Pt Will Perform Toileting - Clothing Manipulation and hygiene: with supervision;sit to/from stand  OT Frequency: Min 2X/week   Barriers to D/C:            Co-evaluation PT/OT/SLP Co-Evaluation/Treatment: Yes     OT goals addressed during session: ADL's and self-care      AM-PAC OT "6 Clicks" Daily Activity     Outcome Measure Help from another person eating meals?: None Help from another person taking care of personal grooming?: None Help from another person toileting, which includes using toliet, bedpan, or urinal?: A Lot Help from another person bathing (including washing, rinsing, drying)?: A Lot Help from another person to put on and taking off regular upper body clothing?: A Little Help from another person to put on and taking off regular lower body clothing?: A Lot 6 Click Score: 17   End of Session Equipment Utilized During Treatment: Rolling walker;Gait belt Nurse Communication: Other (comment)(Nurse present at  start of session)  Activity Tolerance: Patient limited by fatigue;Patient tolerated treatment well Patient left: in chair;with call bell/phone within reach;with chair alarm set  OT Visit Diagnosis: Unsteadiness on feet (R26.81);Muscle weakness (generalized) (M62.81);History of falling (Z91.81);Pain                Time: [id number] OT Time Calculation (min): 18 min Charges:  OT General Charges $OT Visit: 1 Visit OT Evaluation $OT Eval Low  Complexity: Elsie, OT Acute Rehabilitation Services Pager: [phone] Office: [phone]   Hortencia Pilar 06/08/2019, 11:08 AM

## 2019-06-08 NOTE — Progress Notes (Addendum)
Pharmacy Antibiotic Note  Antonio Estes. is a 85 y.o. male admitted on 06/07/2019 with suspected diabetic foot infection with concerns for MRSA. Pharmacy has been consulted for vancomycin dosing.  Pt is afebrile, WBC wnl. Scr appears stable at 0.77. MD dosing ceftriaxone 2g q24h and Flagyl 500 mg q8h.  Plan: Give vancomycin 1500 mg IV x1 loading dose, then give 1250 mg IV q24h (expected AUC 539, Scr used 0.8) Monitor renal function, clinical status  Height: 6\' 5"  (195.6 cm) Weight: 140 lb 14 oz (63.9 kg) IBW/kg (Calculated) : 89.1  Temp (24hrs), Avg:98 F (36.7 C), Min:97.5 F (36.4 C), Max:98.4 F (36.9 C)  Recent Labs  Lab 06/07/19 2032 06/08/19 0629  WBC 4.7 4.7  CREATININE 0.68 0.77    Estimated Creatinine Clearance: 55.5 mL/min (by C-G formula based on SCr of 0.77 mg/dL).    Not on File  Antimicrobials this admission:  Vancomycin 11/10 >> Ceftriaxone 11/19 >> Flagyl 11/19>> Augmentin started at [facility] >> 11/19   Thank you for allowing pharmacy to be a part of this patient's care.  Gaylan Gerold 06/08/2019 10:17 AM

## 2019-06-08 NOTE — Progress Notes (Signed)
PROGRESS NOTE    Antonio Estes.  [id number] DOB: 1927-06-16 DOA: 06/07/2019 PCP: Imagene Riches, NP   Brief Narrative: 85 year old with past medical history significant for peripheral vascular disease, chronic bilateral lower extremity wounds followed by home health and outpatient wound care center who presented to [facility] on 11/13 following a fall 2 days prior to admission due to generalized weakness.  Patient denies any loss of consciousness alert head injury. He was subsequently found to have left lower extremity ischemia.  ABIs show resting ABI of the left extremity in the moderate range arterial occlusive disease and the femoral-popliteal segment and bilateral arteries.  CTA aortic with femoral runoff shows several areas of progressive disease.  Patient was evaluated by Dr. Earleen Newport with IR at [facility] who recommended patient to be transferred to [facility] for tentative aorto peripheral angiogram and possible treatment of left lower extremity. Anticoagulation with heparin was started. He was started on augmenti, for his legs wounds and also coverage of incidental finding of aspiration PNA.    Assessment & Plan:   Active Problems:   Critical lower limb ischemia   Normocytic anemia   PVD (peripheral vascular disease) (HCC)   Failure to thrive in adult  1-Acute ischemia superimposed on peripheral vascular disease with chronic bilateral lower extremity wounds: Patient still with significant redness of lower extremity, malodorous.  Start IV vancomycin and ceftriaxone and Flagyl. Continue with heparin drip. Plan for angiogram tomorrow by IR. He is experiencing severe pain, I have added IV morphine as needed.  Continue with tramadol as needed.  Failure to thrive: PT nutritionist consulted.  I have order Ensure.  Severe protein caloric malnutrition: Added Ensure. Prealbumin 13.2.  aspiration pneumonia: On IV antibiotics.  Speech therapy consult   Hyperkalemia outside facility labs. resolved.  Normocytic anemia: Iron deficiency anemia: Continue with iron supplement. Hypothyroidism: Continue with Synthroid.  Left indirect inguinal hernia; -Incidental finding on the CTA.  No abdominal complaints.  Monitor      Estimated body mass index is 16.71 kg/m as calculated from the following:   Height as of this encounter: 6\' 5"  (1.956 m).   Weight as of this encounter: 63.9 kg.   DVT prophylaxis: Heparin drip Code Status: Full code Family Communication: Discussed with patient Disposition Plan: Main in the hospital for IV heparin, treatment of ischemia lower extremity Consultants:  IR  Procedures:   None  Antimicrobials:  Vancomycin, ceftriaxone and Flagyl  Subjective: He is alert conversant.  He is complaining of severe lower extremity pain.  Oral pain medication is not enough.  Objective: Vitals:   06/07/19 2039 06/07/19 2121 06/08/19 0532  BP:  101/64 (!) 101/46  Pulse:  67 61  Resp:  16 15  Temp:  98.4 F (36.9 C) (!) 97.5 F (36.4 C)  TempSrc:  Oral Oral  SpO2:  97% 97%  Weight: 63.9 kg    Height: 6\' 5"  (1.956 m)      Intake/Output Summary (Last 24 hours) at 06/08/2019 0831 Last data filed at 06/08/2019 0730 Gross per 24 hour  Intake 63.47 ml  Output 775 ml  Net -711.53 ml   Filed Weights   06/07/19 2039  Weight: 63.9 kg    Examination:  General exam: Appears calm and comfortable  Respiratory system: Clear to auscultation. Respiratory effort normal. Cardiovascular system: S1 & S2 heard, RRR. No JVD, murmurs, rubs, gallops or clicks. No pedal edema. Gastrointestinal system: Abdomen is nondistended, soft and nontender. No organomegaly or masses felt. Normal  bowel sounds heard. Central nervous system: Alert and oriented. No focal neurological deficits. Extremities: Symmetric 5 x 5 power. Skin: Bilateral lower extremity with multiple ulceration, redness and oozing,    Data Reviewed: I have  personally reviewed following labs and imaging studies  CBC: Recent Labs  Lab 06/07/19 2032 06/08/19 0629  WBC 4.7 4.7  HGB 9.6* 9.1*  HCT 28.9* 28.4*  MCV 92.6 94.0  PLT 244 [id number]   Basic Metabolic Panel: Recent Labs  Lab 06/07/19 2032 06/08/19 0629  NA 135 137  K 4.1 3.8  CL 105 102  CO2 23 24  GLUCOSE 104* 88  BUN 11 9  CREATININE 0.68 0.77  CALCIUM 8.0* 8.1*   GFR: Estimated Creatinine Clearance: 55.5 mL/min (by C-G formula based on SCr of 0.77 mg/dL). Liver Function Tests: No results for input(s): AST, ALT, ALKPHOS, BILITOT, PROT, ALBUMIN in the last 168 hours. No results for input(s): LIPASE, AMYLASE in the last 168 hours. No results for input(s): AMMONIA in the last 168 hours. Coagulation Profile: Recent Labs  Lab 06/08/19 0629  INR 1.1   Cardiac Enzymes: No results for input(s): CKTOTAL, CKMB, CKMBINDEX, TROPONINI in the last 168 hours. BNP (last 3 results) No results for input(s): PROBNP in the last 8760 hours. HbA1C: No results for input(s): HGBA1C in the last 72 hours. CBG: No results for input(s): GLUCAP in the last 168 hours. Lipid Profile: No results for input(s): CHOL, HDL, LDLCALC, TRIG, CHOLHDL, LDLDIRECT in the last 72 hours. Thyroid Function Tests: No results for input(s): TSH, T4TOTAL, FREET4, T3FREE, THYROIDAB in the last 72 hours. Anemia Panel: No results for input(s): VITAMINB12, FOLATE, FERRITIN, TIBC, IRON, RETICCTPCT in the last 72 hours. Sepsis Labs: No results for input(s): PROCALCITON, LATICACIDVEN in the last 168 hours.  No results found for this or any previous visit (from the past 240 hour(s)).       Radiology Studies: No results found.      Scheduled Meds: . amoxicillin-clavulanate  1 tablet Oral Q12H  . ferrous sulfate  325 mg Oral Q breakfast  . levothyroxine  50 mcg Oral Q0600  . senna-docusate  1 tablet Oral QHS   Continuous Infusions: . heparin 1,050 Units/hr (06/08/19 0421)     LOS: 1 day    Time  spent: 35 minutes    Cylie Dor A Jasma Seevers, MD [facility]  If 7PM-7AM, please contact night-coverage [email or web address] Password [email or web address] 06/08/2019, 8:31 AM

## 2019-06-08 NOTE — Progress Notes (Signed)
ANTICOAGULATION CONSULT NOTE - Follow Up Consult  Pharmacy Consult for Heparin Indication: Limb Ischemia  No Known Allergies  Patient Measurements: Height: 6\' 5"  (195.6 cm) Weight: 140 lb 14 oz (63.9 kg) IBW/kg (Calculated) : 89.1 Heparin Dosing Weight: 63.9 kg  Vital Signs: Temp: 97.7 F (36.5 C) (11/19 1500) Temp Source: Oral (11/19 1500) BP: 121/56 (11/19 1500) Pulse Rate: 70 (11/19 1500)  Labs: Recent Labs    06/07/19 2032 06/07/19 2200 06/08/19 0629 06/08/19 1634  HGB 9.6*  --  9.1*  --   HCT 28.9*  --  28.4*  --   PLT 244  --  257  --   LABPROT  --   --  13.6  --   INR  --   --  1.1  --   HEPARINUNFRC  --  0.12* 0.12* 0.48  CREATININE 0.68  --  0.77  --     Estimated Creatinine Clearance: 55.5 mL/min (by C-G formula based on SCr of 0.77 mg/dL).   Assessment: 85 yr old male transferred from [facility] on heparin infusion for LLE ischemia.   Heparin level this morning on heparin infusion at 1050 units/hr was 0.12 units/ml, which is below the desired goal range. Heparin infusion was increased to 1250 units/hr. Hgb 9.1, Hct 28.4; platelets WNL.  Heparin level drawn this afternoon ~6 hrs after increasing infusion to 1250 units/hr is 0.48 units/ml. Per RN, no issues with IV or bleeding observed.  Plan for angiogram tomorrow by IR.  Goal of Therapy:  Heparin level 0.3-0.7 units/ml Monitor platelets by anticoagulation protocol: Yes   Plan:  Continue heparin infusion at 1250 units/hr Check 8-hr confirmatory heparin level Monitor daily heparin level, CBC Monitor for signs/symptoms of bleeding  Gillermina Hu, PharmD, BCPS, [facility] Clinical Pharmacist 06/08/2019,5:19 PM

## 2019-06-08 NOTE — Social Work (Signed)
CSW acknowledging consult for access to medications at discharge.  For medication access please consult RN Case Management.   TOC team to follow for any further disposition needs.   Westley Hummer, MSW, [place] Work [phone]

## 2019-06-08 NOTE — Consult Note (Signed)
WOC Nurse Consult Note: Reason for Consult: Nonhealing vascular wounds to bilateral anterior lower legs.  Has worn Unnas boots in the past and getting periodic debridement of devitalized tissue.  Is being admitted with left lower extremity ischemia.   Potential for aorta peripheral  angiogram No compression at this time and will proceed with conservative topical care.  Wound type: PVD lower extremities with critical limb ischemia to left lower leg.  Pressure Injury POA: NA Measurement: scattered nonintact 2 cm round nonintact lesions.  Devitalized tissue to wound beds.  Will proceed with painting left leg wounds with betadine daily and leaving open to air.  If drainage increases, cover with foam dressings.  Right leg with slough to wound bed, will implement Santyl to wound bed.  Cover with NS moist gauze and dry dressing. Change daily.   Wound bed: devitalized tissue Drainage (amount, consistency, odor)right leg has minimal serosanguinous   Left leg, none reported at this time.  Periwound: Dry skin with chronic skin changes including hemosiderin staining.  Dressing procedure/placement/frequency: Cleanse left leg with soap and water and pat dry.  Paint wounds with betadine daily.  Cover with dry dressing if drainage increases.  Cleanse right leg wounds with NS and pat dry. Apply Santyl to wound bed.  Cover with NS moist gauze. Secure with dry dressing.  Change daily.  Will not follow at this time.  Please re-consult if needed.  Domenic Moras MSN, RN, FNP-BC CWON Wound, Ostomy, Continence Nurse Pager [phone]

## 2019-06-08 NOTE — Progress Notes (Signed)
Initial Nutrition Assessment  DOCUMENTATION CODES:   Underweight, Severe malnutrition in context of chronic illness  INTERVENTION:   -Ensure Enlive po BID, each supplement provides 350 kcal and 20 grams of protein. -Magic cup TID with meals, each supplement provides 290 kcal and 9 grams of protein. -MVI with minerals  NUTRITION DIAGNOSIS:   Severe Malnutrition related to chronic illness(PVD with bilateral lower extermity wounds) as evidenced by severe fat depletion, severe muscle depletion, energy intake < or equal to 75% for > or equal to 1 month.  GOAL:   Patient will meet greater than or equal to 90% of their needs  MONITOR:   PO intake, Supplement acceptance, Labs, Weight trends  REASON FOR ASSESSMENT:   Consult Wound healing, Assessment of nutrition requirement/status  ASSESSMENT:   85 year old pt with PMH of PVD, anemia and bilateral lower extremity wounds. Pt found to have critical limb ischemia and possible aspiration pneumonia.  Pt was alert and sitting in recliner. Pt stated he typically eats a breakfast of cereal, lunch of leftovers and dinner of frozen meals or whatever meals on wheels brings him. Pt has poor dentition and stated he places all of his food in a food processor. Pt denies choking on food or beverages and says he has no issues with swallowing. Pt said he is often constipated which causes him to eat less than he should.  Discussed the importance of protein for wound healing. Pt stated he would drink chocolate Ensure.  Pt stated he is wt stable and has been the same size for at least 5 years. There are no wts in chart to compare. Pt ate 100% of breakfast.  Medications reviewed: synthroid 74mcg, ferrous sulfate 325mg , rocephin 2g in NaCl 0.9% 168ml IVPB, flagyl IVPB 500mg   Labs reviewed: Potassium 3.8, Hgb 9.1  NUTRITION - FOCUSED PHYSICAL EXAM:    Most Recent Value  Orbital Region  Moderate depletion  Upper Arm Region  Severe depletion  Thoracic  and Lumbar Region  Severe depletion  Buccal Region  Moderate depletion  Temple Region  Severe depletion  Clavicle Bone Region  Severe depletion  Clavicle and Acromion Bone Region  Severe depletion  Scapular Bone Region  Severe depletion  Dorsal Hand  Severe depletion  Patellar Region  Moderate depletion  Anterior Thigh Region  Severe depletion  Posterior Calf Region  Unable to assess  Edema (RD Assessment)  Unable to assess  Hair  Reviewed  Eyes  Reviewed  Mouth  Reviewed  Skin  Reviewed  Nails  Reviewed       Diet Order:   Diet Order            DIET SOFT Room service appropriate? Yes; Fluid consistency: Thin  Diet effective now              EDUCATION NEEDS:   Education needs have been addressed  Skin:  Skin Assessment: Reviewed RN Assessment  Last BM:  11/19  Height:   Ht Readings from Last 1 Encounters:  06/07/19 6\' 5"  (1.956 m)    Weight:   Wt Readings from Last 1 Encounters:  06/07/19 63.9 kg    Ideal Body Weight:  89.1 kg  BMI:  Body mass index is 16.71 kg/m.  Estimated Nutritional Needs:   Kcal:  1900-2100  Protein:  95-105  Fluid:  >1.9L    Allen Norris Dietetic Intern Pager # [phone]

## 2019-06-08 NOTE — Evaluation (Signed)
Clinical/Bedside Swallow Evaluation Patient Details  Name: Antonio Estes. MRN: [id number] Date of Birth: [birth year]  Today's Date: 06/08/2019 Time: SLP Start Time (ACUTE ONLY): 1211 SLP Stop Time (ACUTE ONLY): 1221 SLP Time Calculation (min) (ACUTE ONLY): 10 min  Past Medical History: No past medical history on file. Past Surgical History:  HPI:  85 y.o. male with medical history significant of peripheral vascular disease, chronic bilateral lower extremity wounds followed by home health and outpatient wound care center who presented to ED at [facility] on 11/13 following a fall 2 days prior due to generalized weakness. He was subsequently found to have left lower extremity ischemia. Transferred to [facility] for tentative aorta peripheral angiogram and possible treatment of left lower extremity. Incidential finding of aspiration pnemonia.    Assessment / Plan / Recommendation Clinical Impression  At baseline, pt reports needing to puree solid food items d/t lengthy mastication and difficulty "swallowing solids." He also reports that if food items are softer in texture (such as TV dinner or mac&cheese) he can eat more effectively. He also reports that he has to crush his pills inorder to swallow his medicine. While pt does provide fair amount of information and appears to be a good historian, it is difficult to gather informaiton on when he stated taking the above mentioned measures. He also states that recently he hasn't been able to make these modifications to his food because of his deteriorating health condition. Additionally, he appeared to have difficulty maintaining safe posture during this BSe d/t physical pain and weakness. Pt consumed both hard and soft textures with thin liquids via straw. While pt can consume harder solids, it appeared laborsome and required more than a reasonable amount of time. Therefore would recommend soft solid diet with thin liquids via cup or straw,  medicine crushed in puree. While pt doesn't demonstrate any overt s/s of aspiration at the bedside, collectively he presents with higher risk of aspiraiton given multiple comorbidities. As a result, ST to follow.  SLP Visit Diagnosis: Dysphagia, unspecified (R13.10)    Aspiration Risk  Mild aspiration risk    Diet Recommendation Dysphagia 3 (Mech soft);Thin liquid   Liquid Administration via: Cup;Straw Medication Administration: Crushed with puree Supervision: Patient able to self feed Compensations: Minimize environmental distractions;Slow rate;Small sips/bites Postural Changes: Seated upright at 90 degrees    Other  Recommendations Oral Care Recommendations: Oral care BID   Follow up Recommendations Skilled Nursing facility      Frequency and Duration min 2x/week  2 weeks       Prognosis Prognosis for Safe Diet Advancement: Fair Barriers to Reach Goals: Time post onset      Swallow Study   General Date of Onset: 06/08/19 HPI: 85 y.o. male with medical history significant of peripheral vascular disease, chronic bilateral lower extremity wounds followed by home health and outpatient wound care center who presented to ED at [facility] on 11/13 following a fall 2 days prior due to generalized weakness. He was subsequently found to have left lower extremity ischemia. Transferred to [facility] for tentative aorta peripheral angiogram and possible treatment of left lower extremity. Incidential finding of aspiration pnemonia.  Type of Study: Bedside Swallow Evaluation Previous Swallow Assessment: (none in chart) Diet Prior to this Study: Regular;Thin liquids Temperature Spikes Noted: No Respiratory Status: Room air History of Recent Intubation: No Behavior/Cognition: Alert;Cooperative;Pleasant mood Oral Cavity Assessment: Within Functional Limits Oral Care Completed by SLP: No Oral Cavity - Dentition: Edentulous Vision: Functional for self-feeding Self-Feeding  Abilities:  Able to feed self Patient Positioning: Upright in chair Baseline Vocal Quality: Normal Volitional Cough: Strong Volitional Swallow: Able to elicit    Oral/Motor/Sensory Function Overall Oral Motor/Sensory Function: Within functional limits   Ice Chips Ice chips: Not tested   Thin Liquid Thin Liquid: Within functional limits Presentation: Self Fed;Straw    Nectar Thick Nectar Thick Liquid: Not tested   Honey Thick Honey Thick Liquid: Not tested   Puree Puree: Within functional limits Presentation: Self Fed;Spoon   Solid     Solid: Within functional limits Presentation: Merlin 06/08/2019,1:23 PM

## 2019-06-08 NOTE — Progress Notes (Signed)
ANTICOAGULATION CONSULT NOTE - Follow Up Consult  Pharmacy Consult for heparin Indication: Limb ischemia  Not on File  Patient Measurements: Height: 6\' 5"  (195.6 cm) Weight: 140 lb 14 oz (63.9 kg) IBW/kg (Calculated) : 89.1 Heparin Dosing Weight: 63.9 kg  Vital Signs: Temp: 97.5 F (36.4 C) (11/19 0532) Temp Source: Oral (11/19 0532) BP: 101/46 (11/19 0532) Pulse Rate: 61 (11/19 0532)  Labs: Recent Labs    06/07/19 2032 06/07/19 2200 06/08/19 0629  HGB 9.6*  --  9.1*  HCT 28.9*  --  28.4*  PLT 244  --  257  LABPROT  --   --  13.6  INR  --   --  1.1  HEPARINUNFRC  --  0.12* 0.12*  CREATININE 0.68  --  0.77    Estimated Creatinine Clearance: 55.5 mL/min (by C-G formula based on SCr of 0.77 mg/dL).   Assessment: 89 YOM transferred from [facility] on heparin gtt for LLE ischemia. Heparin gtt currently at 1050 units/hr.   Heparin level this morning is 0.12, subtherapeutic. Hgb 9.1, low stable. Pltc WNL.  Goal of Therapy:  Heparin level 0.3-0.7 units/ml Monitor platelets by anticoagulation protocol: Yes   Plan:  Increase heparin drip rate to 1250 units/hr Heparin level at [place] 06/08/2019,8:37 AM

## 2019-06-09 ENCOUNTER — Other Ambulatory Visit: Payer: Self-pay | Admitting: *Deleted

## 2019-06-09 ENCOUNTER — Encounter (HOSPITAL_COMMUNITY): Payer: Self-pay | Admitting: Interventional Radiology

## 2019-06-09 ENCOUNTER — Inpatient Hospital Stay (HOSPITAL_COMMUNITY): Payer: PPO

## 2019-06-09 DIAGNOSIS — E43 Unspecified severe protein-calorie malnutrition: Secondary | ICD-10-CM | POA: Insufficient documentation

## 2019-06-09 HISTORY — PX: IR US GUIDE VASC ACCESS RIGHT: IMG2390

## 2019-06-09 HISTORY — PX: IR ANGIOGRAM EXTREMITY LEFT: IMG651

## 2019-06-09 HISTORY — PX: IR FEM POP ART ATHERECT INC PTA MOD SED: IMG2310

## 2019-06-09 HISTORY — PX: IR ANGIOGRAM SELECTIVE EACH ADDITIONAL VESSEL: IMG667

## 2019-06-09 LAB — CBC
HCT: 29.3 % — ABNORMAL LOW (ref 39.0–52.0)
Hemoglobin: 9.3 g/dL — ABNORMAL LOW (ref 13.0–17.0)
MCH: 30 pg (ref 26.0–34.0)
MCHC: 31.7 g/dL (ref 30.0–36.0)
MCV: 94.5 fL (ref 80.0–100.0)
Platelets: 264 10*3/uL (ref 150–400)
RBC: 3.1 MIL/uL — ABNORMAL LOW (ref 4.22–5.81)
RDW: 14 % (ref 11.5–15.5)
WBC: 5.2 10*3/uL (ref 4.0–10.5)
nRBC: 0 % (ref 0.0–0.2)

## 2019-06-09 LAB — HEPARIN LEVEL (UNFRACTIONATED): Heparin Unfractionated: <0.1 IU/mL — ABNORMAL LOW (ref 0.30–0.70)

## 2019-06-09 MED ORDER — PROTAMINE SULFATE 10 MG/ML IV SOLN
INTRAVENOUS | Status: AC | PRN
  Administered 2019-06-09: 50 mg via INTRAVENOUS

## 2019-06-09 MED ORDER — IODIXANOL 320 MG/ML IV SOLN
100.0000 mL | Freq: Once | INTRAVENOUS | Status: AC | PRN
  Administered 2019-06-09: 20 mL via INTRA_ARTERIAL

## 2019-06-09 MED ORDER — HEPARIN SODIUM (PORCINE) 1000 UNIT/ML IJ SOLN
INTRAMUSCULAR | Status: AC
  Filled 2019-06-09: qty 1

## 2019-06-09 MED ORDER — FENTANYL CITRATE (PF) 100 MCG/2ML IJ SOLN
INTRAMUSCULAR | Status: AC
  Filled 2019-06-09: qty 2

## 2019-06-09 MED ORDER — PROTAMINE SULFATE 10 MG/ML IV SOLN
50.0000 mg | Freq: Once | INTRAVENOUS | Status: DC
  Filled 2019-06-09: qty 5

## 2019-06-09 MED ORDER — LIDOCAINE HCL (PF) 1 % IJ SOLN
INTRAMUSCULAR | Status: AC | PRN
  Administered 2019-06-09: 10 mL

## 2019-06-09 MED ORDER — MIDAZOLAM HCL 2 MG/2ML IJ SOLN
INTRAMUSCULAR | Status: AC
  Filled 2019-06-09: qty 2

## 2019-06-09 MED ORDER — FENTANYL CITRATE (PF) 100 MCG/2ML IJ SOLN
INTRAMUSCULAR | Status: AC | PRN
  Administered 2019-06-09: 25 ug via INTRAVENOUS

## 2019-06-09 MED ORDER — LIDOCAINE HCL 1 % IJ SOLN
INTRAMUSCULAR | Status: AC
  Filled 2019-06-09: qty 20

## 2019-06-09 MED ORDER — HEPARIN SODIUM (PORCINE) 1000 UNIT/ML IJ SOLN
INTRAMUSCULAR | Status: AC | PRN
  Administered 2019-06-09: 7000 [IU] via INTRAVENOUS

## 2019-06-09 MED ORDER — MIDAZOLAM HCL 2 MG/2ML IJ SOLN
INTRAMUSCULAR | Status: AC | PRN
  Administered 2019-06-09 (×2): 0.5 mg via INTRAVENOUS

## 2019-06-09 MED ORDER — CLOPIDOGREL BISULFATE 75 MG PO TABS
75.0000 mg | ORAL_TABLET | Freq: Every day | ORAL | Status: DC
  Administered 2019-06-10 – 2019-06-13 (×4): 75 mg via ORAL
  Filled 2019-06-09 (×5): qty 1

## 2019-06-09 MED ORDER — ASPIRIN EC 81 MG PO TBEC
81.0000 mg | DELAYED_RELEASE_TABLET | Freq: Every day | ORAL | Status: DC
  Administered 2019-06-10 – 2019-06-13 (×4): 81 mg via ORAL
  Filled 2019-06-09 (×5): qty 1

## 2019-06-09 MED ORDER — IODIXANOL 320 MG/ML IV SOLN
100.0000 mL | Freq: Once | INTRAVENOUS | Status: AC | PRN
  Administered 2019-06-09: 50 mL via INTRA_ARTERIAL

## 2019-06-09 NOTE — Progress Notes (Signed)
SLP Cancellation Note  Patient Details Name: General Filosa. MRN: [id number] DOB: [birth year]   Cancelled treatment:       Reason Eval/Treat Not Completed: Attempted to see pt for swallowing treatment.  Pt politely declined POs today, stating that he had just eaten a big lunch and did not want to eat anything else.  SLP will continue to follow along as able.     Seriah Brotzman, Selinda Orion 06/09/2019, 3:07 PM

## 2019-06-09 NOTE — Procedures (Signed)
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   Closure with angioseal right CFA  Findings: No residual of the left SFA tandem stenosis.  Non-flow limiting dissection of the proximal SFA lesion.  Bilateral PT and DP pulses present by doppler at the conclusion.  Complications: None  Recommendations:  - Right leg straight x 4 hours - frequent NV checks x 4 hours - Routine wound care - Advance diet - Dual anti-platelet with 81mg  ASA and 75mg  Plavix starting tomorrow. Loading dose was given this morning - Stop heparin ggt - OK for DC after 4 hours today or early tomorrow at the convenience of the primary - Follow up with Dr. Earleen Newport in ~4 weeks with repeat non-invasive study.  - Continue wound care with Dr. Jerilee Hoh in [place], [place].  - Do not submerge for 7 days   Signed,  Dulcy Fanny. Earleen Newport, DO

## 2019-06-09 NOTE — Progress Notes (Signed)
Pt transfer to IR for aorto peripheral angiogram, NPO midnight, alert and oriented, with peripheral IV line, no s/s of distress noted.

## 2019-06-09 NOTE — Progress Notes (Signed)
Pt back from OR s/p angiogram LLE , alert and responsive, keep it flat position for 4 hours up to 1430, Nurse tech aware too, right groin dressing dry and intact, no complain of pain at this time.

## 2019-06-09 NOTE — Progress Notes (Signed)
Right groin checked with Thess, RN. Dressing c/d/i. Level 0.

## 2019-06-09 NOTE — Progress Notes (Signed)
Interventional Radiology Post-op check  Procedure:   SP R CFA acces and LLE angiogram, treatment of multifocal SFA disease  Patient reports resolving foot pain. No complaints  He's been taking PO now with no nausea.   Foot is warm and non tender. Right CFA access is clean and dry with no hematoma.    Plan - plavix RX is in the chart - continue dual antiplatelets - local wound care - follow up with Dr. Earleen Newport in ~4 weeks   Signed,  Dulcy Fanny. Earleen Newport, DO

## 2019-06-09 NOTE — Plan of Care (Signed)

## 2019-06-09 NOTE — Progress Notes (Signed)
PROGRESS NOTE    Antonio Estes.  GG:[id number] DOB: September 14, 1926 DOA: 06/07/2019 PCP: Imagene Riches, NP   Brief Narrative: 85 year old with past medical history significant for peripheral vascular disease, chronic bilateral lower extremity wounds followed by home health and outpatient wound care center who presented to [facility] on 11/13 following a fall 2 days prior to admission due to generalized weakness.  Patient denies any loss of consciousness alert head injury. He was subsequently found to have left lower extremity ischemia.  ABIs show resting ABI of the left extremity in the moderate range arterial occlusive disease and the femoral-popliteal segment and bilateral arteries.  CTA aortic with femoral runoff shows several areas of progressive disease.  Patient was evaluated by Dr. Earleen Newport with IR at [facility] who recommended patient to be transferred to [facility] for tentative aorto peripheral angiogram and possible treatment of left lower extremity. Anticoagulation with heparin was started. He was started on augmenti, for his legs wounds and also coverage of incidental finding of aspiration PNA.    Assessment & Plan:   Active Problems:   Critical lower limb ischemia   Normocytic anemia   PVD (peripheral vascular disease) (HCC)   Failure to thrive in adult   Protein-calorie malnutrition, severe  1-Acute ischemia superimposed on peripheral vascular disease with chronic bilateral lower extremity wounds: Patient still with significant redness of lower extremity, malodorous.  Continue with IV vancomycin and ceftriaxone and Flagyl. Discussed with Dr Earleen Newport, patient doesn't need Heparin Gtt post procedure. He was loaded with plavix. Plan to continue with aspirin 81 mg daily and plavix 75 mg daily.  IV morphine and tramadol PRN.  S/P LLE angiogram, treatment of tandem high grade 90 % stenosis of the SFA with atherectomy/DEB, and PTA /DEB with excellent angiographic  outcome.   Failure to thrive: PT nutritionist consulted.  I have order Ensure.  Severe protein caloric malnutrition: Added Ensure. Prealbumin 13.2.  aspiration pneumonia: On IV antibiotics.  Speech therapy consult  Hyperkalemia outside facility labs. resolved.  Normocytic anemia: Iron deficiency anemia: Continue with iron supplement. Hypothyroidism: Continue with Synthroid.  Left indirect inguinal hernia; -Incidental finding on the CTA.  No abdominal complaints.  Monitor      Estimated body mass index is 16.71 kg/m as calculated from the following:   Height as of this encounter: 6\' 5"  (1.956 m).   Weight as of this encounter: 63.9 kg.   DVT prophylaxis: Heparin drip Code Status: Full code Family Communication: Discussed with patient Disposition Plan: he will need PT evaluation, might benefit from SNF Consultants:  IR  Procedures:   None  Antimicrobials:  Vancomycin, ceftriaxone and Flagyl  Subjective: Alert, just came from angiogram, pain improved   Objective: Vitals:   06/09/19 0950 06/09/19 0955 06/09/19 1000 06/09/19 1005  BP: (!) 142/74 (!) 147/71 139/71 124/69  Pulse: 77 78 84 79  Resp: (!) 22 (!) 27 (!) 21 (!) 23  Temp:      TempSrc:      SpO2: 100% 100% 98% 100%  Weight:      Height:        Intake/Output Summary (Last 24 hours) at 06/09/2019 1421 Last data filed at 06/08/2019 2025 Gross per 24 hour  Intake 500 ml  Output 175 ml  Net 325 ml   Filed Weights   06/07/19 2039  Weight: 63.9 kg    Examination:  General exam: NAD Respiratory system: CTA Cardiovascular system: S 1, S 2 RRR Gastrointestinal system: BS present, soft, nt  Central nervous system: Non focal.  Extremities: Symmetric power  Skin: Bilateral lower extremity with multiple ulceration, redness and oozing,    Data Reviewed: I have personally reviewed following labs and imaging studies  CBC: Recent Labs  Lab 06/07/19 2032 06/08/19 0629 06/09/19 0727  WBC 4.7  4.7 5.2  HGB 9.6* 9.1* 9.3*  HCT 28.9* 28.4* 29.3*  MCV 92.6 94.0 94.5  PLT 244 257 [id number]   Basic Metabolic Panel: Recent Labs  Lab 06/07/19 2032 06/08/19 0629  NA 135 137  K 4.1 3.8  CL 105 102  CO2 23 24  GLUCOSE 104* 88  BUN 11 9  CREATININE 0.68 0.77  CALCIUM 8.0* 8.1*   GFR: Estimated Creatinine Clearance: 55.5 mL/min (by C-G formula based on SCr of 0.77 mg/dL). Liver Function Tests: No results for input(s): AST, ALT, ALKPHOS, BILITOT, PROT, ALBUMIN in the last 168 hours. No results for input(s): LIPASE, AMYLASE in the last 168 hours. No results for input(s): AMMONIA in the last 168 hours. Coagulation Profile: Recent Labs  Lab 06/08/19 0629  INR 1.1   Cardiac Enzymes: No results for input(s): CKTOTAL, CKMB, CKMBINDEX, TROPONINI in the last 168 hours. BNP (last 3 results) No results for input(s): PROBNP in the last 8760 hours. HbA1C: Recent Labs    06/08/19 1139  HGBA1C 5.5   CBG: No results for input(s): GLUCAP in the last 168 hours. Lipid Profile: No results for input(s): CHOL, HDL, LDLCALC, TRIG, CHOLHDL, LDLDIRECT in the last 72 hours. Thyroid Function Tests: No results for input(s): TSH, T4TOTAL, FREET4, T3FREE, THYROIDAB in the last 72 hours. Anemia Panel: No results for input(s): VITAMINB12, FOLATE, FERRITIN, TIBC, IRON, RETICCTPCT in the last 72 hours. Sepsis Labs: No results for input(s): PROCALCITON, LATICACIDVEN in the last 168 hours.  No results found for this or any previous visit (from the past 240 hour(s)).       Radiology Studies: Ir Angiogram Extremity Left  Result Date: 06/09/2019 INDICATION: 85 year old male with left lower extremity critical limb ischemia ABI 06/05/2019 Right: 0.93 Left: 0.68 EXAM: ULTRASOUND GUIDED ACCESS RIGHT COMMON FEMORAL ARTERY LEFT LOWER EXTREMITY ANGIOGRAM TREATMENT OF TANDEM CRITICAL STENOSIS OF LEFT SFA WITH DIRECTIONAL ATHERECTOMY AND DRUG-ELUTING BALLOON, AS WELL AS DRUG-ELUTING BALLOON ANGIO-SEAL FOR  HEMOSTASIS MEDICATIONS: 7000 units IV heparin, 50 mg IV protamine sulfate ANESTHESIA/SEDATION: Moderate (conscious) sedation was employed during this procedure. A total of Versed 1.0 mg and Fentanyl 25 mcg was administered intravenously. Moderate Sedation Time: 60 minutes. The patient's level of consciousness and vital signs were monitored continuously by radiology nursing throughout the procedure under my direct supervision. CONTRAST:  70 cc visit base FLUOROSCOPY TIME:  Fluoroscopy Time: 9 minutes 18 seconds (40 mGy). COMPLICATIONS: None PROCEDURE: Informed consent was obtained from the patient following explanation of the procedure, risks, benefits and alternatives. The patient understands, agrees and consents for the procedure. All questions were addressed. A time out was performed prior to the initiation of the procedure. Maximal barrier sterile technique utilized including caps, mask, sterile gowns, sterile gloves, large sterile drape, hand hygiene, and Betadine prep. Ultrasound survey of the right inguinal region was performed with images stored and sent to PACs, confirming patency of the vessel. 1% lidocaine used for local anesthesia. Small stab incision was made with 11 blade scalpel. Blunt dissection was performed under ultrasound. A micropuncture needle was used access the right common femoral artery under ultrasound. With excellent arterial blood flow returned, and an .018 micro wire was passed through the needle, observed enter the abdominal aorta  under fluoroscopy. The needle was removed, and a micropuncture sheath was placed over the wire. The inner dilator and wire were removed, and an 035 Bentson wire was advanced under fluoroscopy into the abdominal aorta. The sheath was removed and a standard 5 [place] vascular sheath was placed. The dilator was removed and the sheath was flushed. Four French omni Flush sheath was advanced to the bifurcation. Bentson wire and a Glidewire were used to navigate into  the distal external iliac artery. Omni Flush sheath was advanced to the distal external iliac artery. Rose in wire was placed into the common femoral artery. Six [place] destination sheath was then placed over the bifurcation. Angiogram of the left lower extremity was performed. IV heparinization was performed with 7000 units. A Glidewire and vertebral catheter we were used to navigate through the proximal critical stenosis and into the distal SFA. A 7 mm spider wire protection device was deployed. Directional atherectomy was performed. Drug-eluting balloon angioplasty was then performed at the proximal SFA with a 6 mm x 60 mm impact balloon. Angiogram was performed. The protection device had migrated proximally. We used the retrieval device to reposition beyond the more distal critical stenosis in the adductor canal. With the protection device redeployed, standard balloon angioplasty was performed at the second treatment site, 5 mm by 40 mm. We then elected to treat this site with a 5 mm by 60 mm impact balloon. Angiogram was performed confirming excellent flow. The protection device was retrieved through the 035 balloon and the balloon was removed with the protection device. Final angiogram was performed of the left lower extremity. Angio-Seal was deployed at the right common femoral artery. Pulses confirmed at the left foot and the right foot. Patient tolerated procedure well and remained hemodynamically stable throughout. No complications were encountered and no significant blood loss. FINDINGS: Proximal SFA lesion greater than 90% stenosis pre treatment Proximal SFA lesion with no residual stenosis post treatment Distal SFA lesion with 80% stenosis pre treatment Distal SFA lesion with no residual stenosis post treatment IMPRESSION: Status post ultrasound guided access right common femoral artery for left lower extremity angiogram and treatment of tandem high-grade stenosis of the superficial femoral artery with  directional atherectomy/drug-eluting balloon, and PTA/drug-eluting balloon restoring excellent flow with no residual stenosis. Angio-Seal for hemostasis. Signed, Dulcy Fanny. Dellia Nims, RPVI [facility] Radiology Electronically Signed   By: Corrie Mckusick D.O.   On: 06/09/2019 10:49   Ir Angiogram Selective Each Additional Vessel  Result Date: 06/09/2019 INDICATION: 85 year old male with left lower extremity critical limb ischemia ABI 06/05/2019 Right: 0.93 Left: 0.68 EXAM: ULTRASOUND GUIDED ACCESS RIGHT COMMON FEMORAL ARTERY LEFT LOWER EXTREMITY ANGIOGRAM TREATMENT OF TANDEM CRITICAL STENOSIS OF LEFT SFA WITH DIRECTIONAL ATHERECTOMY AND DRUG-ELUTING BALLOON, AS WELL AS DRUG-ELUTING BALLOON ANGIO-SEAL FOR HEMOSTASIS MEDICATIONS: 7000 units IV heparin, 50 mg IV protamine sulfate ANESTHESIA/SEDATION: Moderate (conscious) sedation was employed during this procedure. A total of Versed 1.0 mg and Fentanyl 25 mcg was administered intravenously. Moderate Sedation Time: 60 minutes. The patient's level of consciousness and vital signs were monitored continuously by radiology nursing throughout the procedure under my direct supervision. CONTRAST:  70 cc visit base FLUOROSCOPY TIME:  Fluoroscopy Time: 9 minutes 18 seconds (40 mGy). COMPLICATIONS: None PROCEDURE: Informed consent was obtained from the patient following explanation of the procedure, risks, benefits and alternatives. The patient understands, agrees and consents for the procedure. All questions were addressed. A time out was performed prior to the initiation of the  procedure. Maximal barrier sterile technique utilized including caps, mask, sterile gowns, sterile gloves, large sterile drape, hand hygiene, and Betadine prep. Ultrasound survey of the right inguinal region was performed with images stored and sent to PACs, confirming patency of the vessel. 1% lidocaine used for local anesthesia. Small stab incision was  made with 11 blade scalpel. Blunt dissection was performed under ultrasound. A micropuncture needle was used access the right common femoral artery under ultrasound. With excellent arterial blood flow returned, and an .018 micro wire was passed through the needle, observed enter the abdominal aorta under fluoroscopy. The needle was removed, and a micropuncture sheath was placed over the wire. The inner dilator and wire were removed, and an 035 Bentson wire was advanced under fluoroscopy into the abdominal aorta. The sheath was removed and a standard 5 [place] vascular sheath was placed. The dilator was removed and the sheath was flushed. Four French omni Flush sheath was advanced to the bifurcation. Bentson wire and a Glidewire were used to navigate into the distal external iliac artery. Omni Flush sheath was advanced to the distal external iliac artery. Rose in wire was placed into the common femoral artery. Six [place] destination sheath was then placed over the bifurcation. Angiogram of the left lower extremity was performed. IV heparinization was performed with 7000 units. A Glidewire and vertebral catheter we were used to navigate through the proximal critical stenosis and into the distal SFA. A 7 mm spider wire protection device was deployed. Directional atherectomy was performed. Drug-eluting balloon angioplasty was then performed at the proximal SFA with a 6 mm x 60 mm impact balloon. Angiogram was performed. The protection device had migrated proximally. We used the retrieval device to reposition beyond the more distal critical stenosis in the adductor canal. With the protection device redeployed, standard balloon angioplasty was performed at the second treatment site, 5 mm by 40 mm. We then elected to treat this site with a 5 mm by 60 mm impact balloon. Angiogram was performed confirming excellent flow. The protection device was retrieved through the 035 balloon and the balloon was removed with the  protection device. Final angiogram was performed of the left lower extremity. Angio-Seal was deployed at the right common femoral artery. Pulses confirmed at the left foot and the right foot. Patient tolerated procedure well and remained hemodynamically stable throughout. No complications were encountered and no significant blood loss. FINDINGS: Proximal SFA lesion greater than 90% stenosis pre treatment Proximal SFA lesion with no residual stenosis post treatment Distal SFA lesion with 80% stenosis pre treatment Distal SFA lesion with no residual stenosis post treatment IMPRESSION: Status post ultrasound guided access right common femoral artery for left lower extremity angiogram and treatment of tandem high-grade stenosis of the superficial femoral artery with directional atherectomy/drug-eluting balloon, and PTA/drug-eluting balloon restoring excellent flow with no residual stenosis. Angio-Seal for hemostasis. Signed, Dulcy Fanny. Dellia Nims, RPVI [facility]iology [facility] Radiology Electronically Signed   By: Corrie Mckusick D.O.   On: 06/09/2019 10:49   Ir Fem [place] Pta Mod Sed  Result Date: 06/09/2019 INDICATION: 85 year old male with left lower extremity critical limb ischemia ABI 06/05/2019 Right: 0.93 Left: 0.68 EXAM: ULTRASOUND GUIDED ACCESS RIGHT COMMON FEMORAL ARTERY LEFT LOWER EXTREMITY ANGIOGRAM TREATMENT OF TANDEM CRITICAL STENOSIS OF LEFT SFA WITH DIRECTIONAL ATHERECTOMY AND DRUG-ELUTING BALLOON, AS WELL AS DRUG-ELUTING BALLOON ANGIO-SEAL FOR HEMOSTASIS MEDICATIONS: 7000 units IV heparin, 50 mg IV protamine sulfate ANESTHESIA/SEDATION: Moderate (conscious) sedation was employed during this  procedure. A total of Versed 1.0 mg and Fentanyl 25 mcg was administered intravenously. Moderate Sedation Time: 60 minutes. The patient's level of consciousness and vital signs were monitored continuously by radiology nursing throughout the procedure under my direct  supervision. CONTRAST:  70 cc visit base FLUOROSCOPY TIME:  Fluoroscopy Time: 9 minutes 18 seconds (40 mGy). COMPLICATIONS: None PROCEDURE: Informed consent was obtained from the patient following explanation of the procedure, risks, benefits and alternatives. The patient understands, agrees and consents for the procedure. All questions were addressed. A time out was performed prior to the initiation of the procedure. Maximal barrier sterile technique utilized including caps, mask, sterile gowns, sterile gloves, large sterile drape, hand hygiene, and Betadine prep. Ultrasound survey of the right inguinal region was performed with images stored and sent to PACs, confirming patency of the vessel. 1% lidocaine used for local anesthesia. Small stab incision was made with 11 blade scalpel. Blunt dissection was performed under ultrasound. A micropuncture needle was used access the right common femoral artery under ultrasound. With excellent arterial blood flow returned, and an .018 micro wire was passed through the needle, observed enter the abdominal aorta under fluoroscopy. The needle was removed, and a micropuncture sheath was placed over the wire. The inner dilator and wire were removed, and an 035 Bentson wire was advanced under fluoroscopy into the abdominal aorta. The sheath was removed and a standard 5 [place] vascular sheath was placed. The dilator was removed and the sheath was flushed. Four French omni Flush sheath was advanced to the bifurcation. Bentson wire and a Glidewire were used to navigate into the distal external iliac artery. Omni Flush sheath was advanced to the distal external iliac artery. Rose in wire was placed into the common femoral artery. Six [place] destination sheath was then placed over the bifurcation. Angiogram of the left lower extremity was performed. IV heparinization was performed with 7000 units. A Glidewire and vertebral catheter we were used to navigate through the proximal critical  stenosis and into the distal SFA. A 7 mm spider wire protection device was deployed. Directional atherectomy was performed. Drug-eluting balloon angioplasty was then performed at the proximal SFA with a 6 mm x 60 mm impact balloon. Angiogram was performed. The protection device had migrated proximally. We used the retrieval device to reposition beyond the more distal critical stenosis in the adductor canal. With the protection device redeployed, standard balloon angioplasty was performed at the second treatment site, 5 mm by 40 mm. We then elected to treat this site with a 5 mm by 60 mm impact balloon. Angiogram was performed confirming excellent flow. The protection device was retrieved through the 035 balloon and the balloon was removed with the protection device. Final angiogram was performed of the left lower extremity. Angio-Seal was deployed at the right common femoral artery. Pulses confirmed at the left foot and the right foot. Patient tolerated procedure well and remained hemodynamically stable throughout. No complications were encountered and no significant blood loss. FINDINGS: Proximal SFA lesion greater than 90% stenosis pre treatment Proximal SFA lesion with no residual stenosis post treatment Distal SFA lesion with 80% stenosis pre treatment Distal SFA lesion with no residual stenosis post treatment IMPRESSION: Status post ultrasound guided access right common femoral artery for left lower extremity angiogram and treatment of tandem high-grade stenosis of the superficial femoral artery with directional atherectomy/drug-eluting balloon, and PTA/drug-eluting balloon restoring excellent flow with no residual stenosis. Angio-Seal for hemostasis. Signed, Dulcy Fanny. Dellia Nims, [place] [facility]  Special[facility] Radiology Electronically Signed   By: Corrie Mckusick D.O.   On: 06/09/2019 10:49   Ir US Guide Vasc Access Right  Result Date: 06/09/2019 INDICATION: 85 year old  male with left lower extremity critical limb ischemia ABI 06/05/2019 Right: 0.93 Left: 0.68 EXAM: ULTRASOUND GUIDED ACCESS RIGHT COMMON FEMORAL ARTERY LEFT LOWER EXTREMITY ANGIOGRAM TREATMENT OF TANDEM CRITICAL STENOSIS OF LEFT SFA WITH DIRECTIONAL ATHERECTOMY AND DRUG-ELUTING BALLOON, AS WELL AS DRUG-ELUTING BALLOON ANGIO-SEAL FOR HEMOSTASIS MEDICATIONS: 7000 units IV heparin, 50 mg IV protamine sulfate ANESTHESIA/SEDATION: Moderate (conscious) sedation was employed during this procedure. A total of Versed 1.0 mg and Fentanyl 25 mcg was administered intravenously. Moderate Sedation Time: 60 minutes. The patient's level of consciousness and vital signs were monitored continuously by radiology nursing throughout the procedure under my direct supervision. CONTRAST:  70 cc visit base FLUOROSCOPY TIME:  Fluoroscopy Time: 9 minutes 18 seconds (40 mGy). COMPLICATIONS: None PROCEDURE: Informed consent was obtained from the patient following explanation of the procedure, risks, benefits and alternatives. The patient understands, agrees and consents for the procedure. All questions were addressed. A time out was performed prior to the initiation of the procedure. Maximal barrier sterile technique utilized including caps, mask, sterile gowns, sterile gloves, large sterile drape, hand hygiene, and Betadine prep. Ultrasound survey of the right inguinal region was performed with images stored and sent to PACs, confirming patency of the vessel. 1% lidocaine used for local anesthesia. Small stab incision was made with 11 blade scalpel. Blunt dissection was performed under ultrasound. A micropuncture needle was used access the right common femoral artery under ultrasound. With excellent arterial blood flow returned, and an .018 micro wire was passed through the needle, observed enter the abdominal aorta under fluoroscopy. The needle was removed, and a micropuncture sheath was placed over the wire. The inner dilator and wire were  removed, and an 035 Bentson wire was advanced under fluoroscopy into the abdominal aorta. The sheath was removed and a standard 5 [place] vascular sheath was placed. The dilator was removed and the sheath was flushed. Four French omni Flush sheath was advanced to the bifurcation. Bentson wire and a Glidewire were used to navigate into the distal external iliac artery. Omni Flush sheath was advanced to the distal external iliac artery. Rose in wire was placed into the common femoral artery. Six [place] destination sheath was then placed over the bifurcation. Angiogram of the left lower extremity was performed. IV heparinization was performed with 7000 units. A Glidewire and vertebral catheter we were used to navigate through the proximal critical stenosis and into the distal SFA. A 7 mm spider wire protection device was deployed. Directional atherectomy was performed. Drug-eluting balloon angioplasty was then performed at the proximal SFA with a 6 mm x 60 mm impact balloon. Angiogram was performed. The protection device had migrated proximally. We used the retrieval device to reposition beyond the more distal critical stenosis in the adductor canal. With the protection device redeployed, standard balloon angioplasty was performed at the second treatment site, 5 mm by 40 mm. We then elected to treat this site with a 5 mm by 60 mm impact balloon. Angiogram was performed confirming excellent flow. The protection device was retrieved through the 035 balloon and the balloon was removed with the protection device. Final angiogram was performed of the left lower extremity. Angio-Seal was deployed at the right common femoral artery. Pulses confirmed at the left foot and the right foot. Patient tolerated procedure well and remained hemodynamically stable throughout. No  complications were encountered and no significant blood loss. FINDINGS: Proximal SFA lesion greater than 90% stenosis pre treatment Proximal SFA lesion with no  residual stenosis post treatment Distal SFA lesion with 80% stenosis pre treatment Distal SFA lesion with no residual stenosis post treatment IMPRESSION: Status post ultrasound guided access right common femoral artery for left lower extremity angiogram and treatment of tandem high-grade stenosis of the superficial femoral artery with directional atherectomy/drug-eluting balloon, and PTA/drug-eluting balloon restoring excellent flow with no residual stenosis. Angio-Seal for hemostasis. Signed, Dulcy Fanny. Dellia Nims, RPVI [facility] [facility] Radiology Electronically Signed   By: Corrie Mckusick D.O.   On: 06/09/2019 10:49        Scheduled Meds:  [START ON 06/10/2019] aspirin EC  81 mg Oral Daily   [START ON 06/10/2019] clopidogrel  75 mg Oral Daily   collagenase   Topical Daily   feeding supplement (ENSURE ENLIVE)  237 mL Oral BID BM   fentaNYL       ferrous sulfate  325 mg Oral Q breakfast   heparin       levothyroxine  50 mcg Oral Q0600   lidocaine       midazolam       multivitamin with minerals  1 tablet Oral Daily   protamine  50 mg Intravenous Once   senna-docusate  1 tablet Oral QHS   Continuous Infusions:  sodium chloride     cefTRIAXone (ROCEPHIN)  IV 2 g (06/09/19 1241)   metronidazole 500 mg (06/09/19 1240)   vancomycin       LOS: 2 days    Time spent: 35 minutes    Shivani Barrantes A Deylan Canterbury, MD [facility]  If 7PM-7AM, please contact night-coverage [email or web address] Password [email or web address] 06/09/2019, 2:21 PM

## 2019-06-09 NOTE — Progress Notes (Signed)
Interventional Radiology Preprocedure Note   85 yo male with LLE CLI/CLTI.   No change in the interval.    Plan to treat today.  Angiogram and possible intervention.   Signed,  Dulcy Fanny. Earleen Newport, DO

## 2019-06-10 ENCOUNTER — Other Ambulatory Visit: Payer: Self-pay

## 2019-06-10 LAB — CBC
HCT: 28.9 % — ABNORMAL LOW (ref 39.0–52.0)
Hemoglobin: 9.3 g/dL — ABNORMAL LOW (ref 13.0–17.0)
MCH: 30.4 pg (ref 26.0–34.0)
MCHC: 32.2 g/dL (ref 30.0–36.0)
MCV: 94.4 fL (ref 80.0–100.0)
Platelets: 258 10*3/uL (ref 150–400)
RBC: 3.06 MIL/uL — ABNORMAL LOW (ref 4.22–5.81)
RDW: 14.3 % (ref 11.5–15.5)
WBC: 6 10*3/uL (ref 4.0–10.5)
nRBC: 0 % (ref 0.0–0.2)

## 2019-06-10 LAB — BASIC METABOLIC PANEL
Anion gap: 8 (ref 5–15)
BUN: 9 mg/dL (ref 8–23)
CO2: 23 mmol/L (ref 22–32)
Calcium: 8 mg/dL — ABNORMAL LOW (ref 8.9–10.3)
Chloride: 106 mmol/L (ref 98–111)
Creatinine, Ser: 0.77 mg/dL (ref 0.61–1.24)
GFR calc Af Amer: >60 mL/min (ref >60)
GFR calc non Af Amer: >60 mL/min (ref >60)
Glucose, Bld: 96 mg/dL (ref 70–99)
Potassium: 3.5 mmol/L (ref 3.5–5.1)
Sodium: 137 mmol/L (ref 135–145)

## 2019-06-10 LAB — SARS CORONAVIRUS 2 (TAT 6-24 HRS): SARS Coronavirus 2: NEGATIVE

## 2019-06-10 NOTE — TOC Initial Note (Signed)
Transition of Care (TOC) - Initial/Assessment Note    Patient Details  Name: Antonio Estes. MRN: [id number] Date of Birth: Dec 21, 1926  Transition of Care [facility]) CM/SW Contact:    Benard Halsted, LCSW Phone Number: 06/10/2019, 2:02 PM  Clinical Narrative:                 CSW received consult for possible SNF placement at time of discharge. CSW spoke with patient regarding PT recommendation of SNF placement at time of discharge. Patient reported that he lives alone and does not have anyone to help him. Patient expressed understanding of PT recommendation and is agreeable to SNF placement at time of discharge. Patient reports preference for a facility in [place] such as [facility]. CSW discussed insurance authorization process (and copay cost) and provided Medicare SNF ratings list. Patient expressed being hopeful for rehab and to feel better soon. He states he cannot afford to stay at SNF too long with the $10-20/day copays. No further questions reported at this time. CSW to continue to send referral to facilities for review. Patient will need updated COVID test 24-48 hours prior to discharge.    Expected Discharge Plan: Skilled Nursing Facility Barriers to Discharge: Insurance Authorization, Continued Medical Work up   Patient Goals and CMS Choice Patient states their goals for this hospitalization and ongoing recovery are:: Rehab to get stronger CMS Medicare.gov Compare Post Acute Care list provided to:: Patient Choice offered to / list presented to : Patient  Expected Discharge Plan and Services Expected Discharge Plan: [place] In-house Referral: Clinical Social Work Discharge Planning Services: NA Post Acute Care Choice: [place] Living arrangements for the past 2 months: Single Family Home                 DME Arranged: N/A         HH Arranged: NA          Prior Living Arrangements/Services Living arrangements for the past 2  months: Single Family Home Lives with:: Self Patient language and need for interpreter reviewed:: Yes Do you feel safe going back to the place where you live?: Yes      Need for Family Participation in Patient Care: No (Comment) Care giver support system in place?: No (comment)   Criminal Activity/Legal Involvement Pertinent to Current Situation/Hospitalization: No - Comment as needed  Activities of Daily Living Home Assistive Devices/Equipment: None ADL Screening (condition at time of admission) Patient's cognitive ability adequate to safely complete daily activities?: Yes Is the patient deaf or have difficulty hearing?: No Does the patient have difficulty seeing, even when wearing glasses/contacts?: No Does the patient have difficulty concentrating, remembering, or making decisions?: No Patient able to express need for assistance with ADLs?: Yes Does the patient have difficulty dressing or bathing?: No Independently performs ADLs?: Yes (appropriate for developmental age) Does the patient have difficulty walking or climbing stairs?: Yes Weakness of Legs: Both Weakness of Arms/Hands: None  Permission Sought/Granted Permission sought to share information with : Facility Art therapist granted to share information with : Yes, Verbal Permission Granted     Permission granted to share info w AGENCY: SNFs        Emotional Assessment Appearance:: Appears stated age Attitude/Demeanor/Rapport: Engaged, Gracious Affect (typically observed): Accepting, Appropriate Orientation: : Oriented to Self, Oriented to Place, Oriented to  Time, Oriented to Situation Alcohol / Substance Use: Not Applicable Psych Involvement: No (comment)  Admission diagnosis:  ischemia Patient Active Problem List  Diagnosis Date Noted  . Protein-calorie malnutrition, severe 06/09/2019  . Critical lower limb ischemia 06/07/2019  . Normocytic anemia 06/07/2019  . PVD (peripheral vascular  disease) ([place]) 06/07/2019  . Failure to thrive in adult 06/07/2019   PCP:  Imagene Riches, NP Pharmacy:   [facility] [place]  [address] Phone: [phone] Fax: [phone]     Social Determinants of Health (SDOH) Interventions    Readmission Risk Interventions No flowsheet data found.

## 2019-06-10 NOTE — Progress Notes (Signed)
Occupational Therapy Treatment Patient Details Name: Antonio Estes. MRN: [id number] DOB: 1927-06-02 Today's Date: 06/10/2019    History of present illness Pt is a 85 y/o male admitted secondary to generalized weakness and falls at home. Pt was found to have L LE ischemia and incidental finding of aspiration pnuemonia. Plan is for tentative aorta peripheral angiogram and possible treatment of the L LE. PMH including but not limited to PVD and chronic bilateral LE wounds.   OT comments  Pt admitted with generalized weakness and falls at home. Pt currently with functional limitations due to the deficits listed below (see OT Problem List). Pt said they did not want to do much as they did everything today. Pt then completed sit to stand tranfers with supervision to RW. Pt completed LE dressing with mod assist.  Pt will benefit from skilled OT to increase their safety and independence with ADL and functional mobility for ADL to facilitate discharge to venue listed below.     Follow Up Recommendations  SNF;Supervision/Assistance - 24 hour    Equipment Recommendations  Other (comment)    Recommendations for Other Services      Precautions / Restrictions Precautions Precautions: Fall Restrictions Weight Bearing Restrictions: No       Mobility Bed Mobility Overal bed mobility: (presented in chair) Bed Mobility: Supine to Sit     Supine to sit: Min guard;HOB elevated     General bed mobility comments: Pt able to come to EOB and scoot forward to use urinal with min/guard  Transfers Overall transfer level: Needs assistance Equipment used: Rolling walker (2 wheeled) Transfers: Sit to/from Stand Sit to Stand: Min guard;From elevated surface         General transfer comment: MIN A to power up, but no LOB today    Balance Overall balance assessment: Needs assistance;History of Falls Sitting-balance support: Feet supported Sitting balance-Leahy Scale: Fair     Standing  balance support: Bilateral upper extremity supported Standing balance-Leahy Scale: Fair Standing balance comment: required UE support                           ADL either performed or assessed with clinical judgement   ADL Overall ADL's : Needs assistance/impaired Eating/Feeding: Set up;Sitting   Grooming: Set up;Supervision/safety;Sitting   Upper Body Bathing: Set up;Sitting   Lower Body Bathing: Moderate assistance;Cueing for safety;Cueing for sequencing;Sit to/from stand   Upper Body Dressing : Set up;Supervision/safety;Sitting   Lower Body Dressing: Moderate assistance;Cueing for safety;Cueing for sequencing;Sit to/from stand   Toilet Transfer: Minimal assistance                   Vision       Perception     Praxis      Cognition Arousal/Alertness: Awake/alert Behavior During Therapy: Flat affect Overall Cognitive Status: Within Functional Limits for tasks assessed                                          Exercises     Shoulder Instructions       General Comments LE wrapped in gauze    Pertinent Vitals/ Pain       Pain Assessment: 0-10 Pain Score: 2  Faces Pain Scale: Hurts a little bit Pain Location: BLE Pain Descriptors / Indicators: Grimacing Pain Intervention(s): Limited activity within patient's tolerance;Monitored during session;Repositioned  Home Living                                          Prior Functioning/Environment              Frequency  Min 2X/week        Progress Toward Goals  OT Goals(current goals can now be found in the care plan section)  Progress towards OT goals: Progressing toward goals  Acute Rehab OT Goals Patient Stated Goal: to get stronger OT Goal Formulation: With patient Time For Goal Achievement: 06/22/19 Potential to Achieve Goals: Good ADL Goals Pt Will Perform Lower Body Bathing: sit to/from stand;with supervision Pt Will Perform Lower Body  Dressing: with supervision;sit to/from stand Pt Will Transfer to Toilet: with supervision;ambulating Pt Will Perform Toileting - Clothing Manipulation and hygiene: with supervision;sit to/from stand  Plan Discharge plan remains appropriate    Co-evaluation    PT/OT/SLP Co-Evaluation/Treatment: Yes            AM-PAC OT "6 Clicks" Daily Activity     Outcome Measure   Help from another person eating meals?: None Help from another person taking care of personal grooming?: None Help from another person toileting, which includes using toliet, bedpan, or urinal?: A Lot Help from another person bathing (including washing, rinsing, drying)?: A Lot Help from another person to put on and taking off regular upper body clothing?: A Little Help from another person to put on and taking off regular lower body clothing?: A Lot 6 Click Score: 17    End of Session Equipment Utilized During Treatment: Rolling walker;Gait belt  OT Visit Diagnosis: Unsteadiness on feet (R26.81);Muscle weakness (generalized) (M62.81);History of falling (Z91.81);Pain   Activity Tolerance Patient limited by fatigue;Patient tolerated treatment well   Patient Left in chair;with call bell/phone within reach;with chair alarm set   Nurse Communication          Time: [id number] OT Time Calculation (min): 15 min  Charges: OT General Charges $OT Visit: 1 Visit OT Treatments $Self Care/Home Management : 8-22 mins  Joeseph Amor OTR/L  [place]  [phone] office number [phone] pager number    Joeseph Amor 06/10/2019, 2:25 PM

## 2019-06-10 NOTE — Progress Notes (Signed)
Physical Therapy Treatment Patient Details Name: Antonio Estes. MRN: [id number] DOB: February 28, 1927 Today's Date: 06/10/2019    History of Present Illness Pt is a 85 y/o male admitted secondary to generalized weakness and falls at home. Pt was found to have L LE ischemia and incidental finding of aspiration pnuemonia. Plan is for tentative aorta peripheral angiogram and possible treatment of the L LE. PMH including but not limited to PVD and chronic bilateral LE wounds.    PT Comments    Pt able to increase ambulation today. Pt was fatigued by the end, but was pleased at how much stronger he felt. Pt received new PT order today. Pt was already on caseload and con't to recommend SNF.   Follow Up Recommendations  SNF     Equipment Recommendations  None recommended by PT    Recommendations for Other Services       Precautions / Restrictions Restrictions Weight Bearing Restrictions: No    Mobility  Bed Mobility Overal bed mobility: Needs Assistance Bed Mobility: Supine to Sit     Supine to sit: Min guard;HOB elevated     General bed mobility comments: Pt able to come to EOB and scoot forward to use urinal with min/guard  Transfers Overall transfer level: Needs assistance Equipment used: Rolling walker (2 wheeled) Transfers: Sit to/from Stand Sit to Stand: Min assist;+2 safety/equipment         General transfer comment: MIN A to power up, but no LOB today  Ambulation/Gait Ambulation/Gait assistance: Min guard;+2 safety/equipment Gait Distance (Feet): 20 Feet Assistive device: Rolling walker (2 wheeled) Gait Pattern/deviations: Step-through pattern;Trunk flexed Gait velocity: decreased   General Gait Details: Pt ambulated in room 20' and was pleased with how much stronger he felt.  He managed turns well, but fatigued at the end.   Stairs             Wheelchair Mobility    Modified Rankin (Stroke Patients Only)       Balance   Sitting-balance  support: Feet supported Sitting balance-Leahy Scale: Fair     Standing balance support: Bilateral upper extremity supported Standing balance-Leahy Scale: Poor Standing balance comment: required UE support                            Cognition Arousal/Alertness: Awake/alert Behavior During Therapy: Flat affect Overall Cognitive Status: Within Functional Limits for tasks assessed                                        Exercises      General Comments General comments (skin integrity, edema, etc.): LE wrapped in gauze      Pertinent Vitals/Pain Pain Assessment: Faces Faces Pain Scale: Hurts a little bit Pain Location: BLE Pain Descriptors / Indicators: Grimacing Pain Intervention(s): Limited activity within patient's tolerance;Monitored during session;Repositioned    Home Living                      Prior Function            PT Goals (current goals can now be found in the care plan section) Acute Rehab PT Goals PT Goal Formulation: With patient Time For Goal Achievement: 06/22/19 Potential to Achieve Goals: Good Progress towards PT goals: Progressing toward goals    Frequency    Min 2X/week  PT Plan Current plan remains appropriate    Co-evaluation              AM-PAC PT "6 Clicks" Mobility   Outcome Measure  Help needed turning from your back to your side while in a flat bed without using bedrails?: None Help needed moving from lying on your back to sitting on the side of a flat bed without using bedrails?: None Help needed moving to and from a bed to a chair (including a wheelchair)?: A Little Help needed standing up from a chair using your arms (e.g., wheelchair or bedside chair)?: A Little Help needed to walk in hospital room?: A Little Help needed climbing 3-5 steps with a railing? : A Lot 6 Click Score: 19    End of Session Equipment Utilized During Treatment: Gait belt Activity Tolerance: Patient  tolerated treatment well Patient left: in chair;with call bell/phone within reach;with chair alarm set Nurse Communication: Mobility status;Other (comment)(nurse tech) PT Visit Diagnosis: Other abnormalities of gait and mobility (R26.89)     Time: [id number] PT Time Calculation (min) (ACUTE ONLY): 18 min  Charges:  $Gait Training: 8-22 mins                     Annaleia Pence L. Tamala Julian, [place] Pager [phone] 06/10/2019    Galen Manila 06/10/2019, 1:09 PM

## 2019-06-10 NOTE — NC FL2 (Signed)
[place] LEVEL OF CARE SCREENING TOOL     IDENTIFICATION  Patient Name: Antonio Estes. Birthdate: 1926/10/26 Sex: male Admission Date (Current Location): 06/07/2019  [facility] and [place] Number:  [employer] and Address:  [facility] [place] [address], [place], [place] [address]      Provider Number: [id number]  Attending Physician Name and Address:  Elmarie Shiley, MD  Relative Name and Phone Number:       Current Level of Care: Hospital Recommended Level of Care: [place] Prior Approval Number:    Date Approved/Denied:   PASRR Number: [id number]  Discharge Plan: SNF    Current Diagnoses: Patient Active Problem List   Diagnosis Date Noted  . Protein-calorie malnutrition, severe 06/09/2019  . Critical lower limb ischemia 06/07/2019  . Normocytic anemia 06/07/2019  . PVD (peripheral vascular disease) ([place]) 06/07/2019  . Failure to thrive in adult 06/07/2019    Orientation RESPIRATION BLADDER Height & Weight     Self, Time, Situation, Place  Normal Continent Weight: 140 lb 14 oz (63.9 kg) Height:  6\' 5"  (195.6 cm)  BEHAVIORAL SYMPTOMS/MOOD NEUROLOGICAL BOWEL NUTRITION STATUS      Continent Diet(Please see Dc summary)  AMBULATORY STATUS COMMUNICATION OF NEEDS Skin   Limited Assist Verbally Other (Comment)(venous stasis ulcer on leg dressed with Santyl and guaze)                       Personal Care Assistance Level of Assistance  Bathing, Feeding, Dressing Bathing Assistance: Limited assistance Feeding assistance: Independent Dressing Assistance: Limited assistance     Functional Limitations Info  Sight, Hearing, Speech Sight Info: Adequate Hearing Info: Adequate Speech Info: Adequate    SPECIAL CARE FACTORS FREQUENCY  PT (By licensed PT), OT (By licensed OT)     PT Frequency: 5x OT Frequency: 3x            Contractures Contractures Info: Not present    Additional  Factors Info  Code Status, Allergies Code Status Info: Full Allergies Info: NKA           Current Medications (06/10/2019):  This is the current hospital active medication list Current Facility-Administered Medications  Medication Dose Route Frequency Provider Last Rate Last Dose  . 0.9 %  sodium chloride infusion   Intravenous PRN Regalado, Belkys A, MD 10 mL/hr at 06/10/19 1039 1,000 mL at 06/10/19 1039  . aspirin EC tablet 81 mg  81 mg Oral Daily Regalado, Belkys A, MD   81 mg at 06/10/19 1040  . cefTRIAXone (ROCEPHIN) 2 g in sodium chloride 0.9 % 100 mL IVPB  2 g Intravenous Q24H Regalado, Belkys A, MD 200 mL/hr at 06/10/19 1053 2 g at 06/10/19 1053  . clopidogrel (PLAVIX) tablet 75 mg  75 mg Oral Daily Regalado, Belkys A, MD   75 mg at 06/10/19 1040  . collagenase (SANTYL) ointment   Topical Daily Regalado, Belkys A, MD      . feeding supplement (ENSURE ENLIVE) (ENSURE ENLIVE) liquid 237 mL  237 mL Oral BID BM Regalado, Belkys A, MD      . ferrous sulfate tablet 325 mg  325 mg Oral Q breakfast Tu, Ching T, DO   325 mg at 06/10/19 0905  . levothyroxine (SYNTHROID) tablet 50 mcg  50 mcg Oral Q0600 Tu, Ching T, DO   50 mcg at 06/10/19 0541  . metroNIDAZOLE (FLAGYL) IVPB 500 mg  500 mg Intravenous Q8H  Regalado, Belkys A, MD 100 mL/hr at 06/10/19 1040 500 mg at 06/10/19 1040  . morphine 2 MG/ML injection 0.5-1 mg  0.5-1 mg Intravenous Q4H PRN Regalado, Belkys A, MD   1 mg at 06/09/19 0301  . multivitamin with minerals tablet 1 tablet  1 tablet Oral Daily Regalado, Belkys A, MD   1 tablet at 06/10/19 1040  . pneumococcal 23 valent vaccine (PNEUMOVAX-23) injection 0.5 mL  0.5 mL Intramuscular Prior to discharge Tu, Ching T, DO      . protamine injection 50 mg  50 mg Intravenous Once Corrie Mckusick, DO      . senna-docusate (Senokot-S) tablet 1 tablet  1 tablet Oral QHS Tu, Ching T, DO   1 tablet at 06/09/19 2046  . traMADol (ULTRAM) tablet 50 mg  50 mg Oral Q6H PRN Tu, Ching T, DO   50 mg at  06/10/19 1006  . vancomycin (VANCOCIN) 1,250 mg in sodium chloride 0.9 % 250 mL IVPB  1,250 mg Intravenous Q24H Wendee Beavers, [facility]   Stopped at 06/09/19 [id number]     Discharge Medications: Please see discharge summary for a list of discharge medications.  Relevant Imaging Results:  Relevant Lab Results:   Additional Information SSN: [place]ne [place], [place]

## 2019-06-10 NOTE — Progress Notes (Signed)
PROGRESS NOTE    Antonio Estes.  UV:[id number] DOB: Dec 09, 1926 DOA: 06/07/2019 PCP: Imagene Riches, NP   Brief Narrative: 85 year old with past medical history significant for peripheral vascular disease, chronic bilateral lower extremity wounds followed by home health and outpatient wound care center who presented to [facility] on 11/13 following a fall 2 days prior to admission due to generalized weakness.  Patient denies any loss of consciousness alert head injury. He was subsequently found to have left lower extremity ischemia.  ABIs show resting ABI of the left extremity in the moderate range arterial occlusive disease and the femoral-popliteal segment and bilateral arteries.  CTA aortic with femoral runoff shows several areas of progressive disease.  Patient was evaluated by Dr. Earleen Newport with IR at [facility] who recommended patient to be transferred to [facility] for tentative aorto peripheral angiogram and possible treatment of left lower extremity. Anticoagulation with heparin was started. He was started on augmenti, for his legs wounds and also coverage of incidental finding of aspiration PNA.    Assessment & Plan:   Active Problems:   Critical lower limb ischemia   Normocytic anemia   PVD (peripheral vascular disease) (HCC)   Failure to thrive in adult   Protein-calorie malnutrition, severe  1-Acute ischemia superimposed on peripheral vascular disease with chronic bilateral lower extremity wounds: superimpose cellulitis.  Patient still with significant redness of lower extremity, malodorous.  Continue with IV vancomycin and ceftriaxone and Flagyl. Discussed with Dr Earleen Newport, patient doesn't need Heparin Gtt post procedure. He was loaded with plavix. Plan to continue with aspirin 81 mg daily and plavix 75 mg daily.  IV morphine and tramadol PRN.  S/P LLE angiogram, treatment of tandem high grade 90 % stenosis of the SFA with atherectomy/DEB, and PTA /DEB with  excellent angiographic outcome.  Continue with IV antibiotics for one more day.  Needs PT evaluation.  He was bleeding from one of the ulcer LE>   Failure to thrive: PT nutritionist consulted.  I have order Ensure.  Severe protein caloric malnutrition: Added Ensure. Prealbumin 13.2.  aspiration pneumonia: On IV antibiotics.  Speech therapy consult  Hyperkalemia outside facility labs. resolved.  Normocytic anemia: Iron deficiency anemia: Continue with iron supplement. Hypothyroidism: Continue with Synthroid.  Left indirect inguinal hernia; -Incidental finding on the CTA.  No abdominal complaints.  Monitor      Estimated body mass index is 16.71 kg/m as calculated from the following:   Height as of this encounter: 6\' 5"  (1.956 m).   Weight as of this encounter: 63.9 kg.   DVT prophylaxis: Heparin drip Code Status: Full code Family Communication: Discussed with patient Disposition Plan: Awaiting PT evaluation today, continue with 1 more day of IV antibiotic, he will likely benefit from a skilled nursing facility for rehab and wound care. Consultants:  IR  Procedures:   None  Antimicrobials:  Vancomycin, ceftriaxone and Flagyl  Subjective: He reports pain is better controlled.  He was bleeding a lot last night from ulcer in his ankle.  He is rate of walking Objective: Vitals:   06/09/19 1000 06/09/19 1005 06/09/19 2043 06/10/19 0535  BP: 139/71 124/69 121/69 119/63  Pulse: 84 79 66 75  Resp: (!) 21 (!) 23    Temp:   (!) 97.5 F (36.4 C) 97.8 F (36.6 C)  TempSrc:   Oral   SpO2: 98% 100% 96% 96%  Weight:      Height:        Intake/Output Summary (Last 24  hours) at 06/10/2019 1207 Last data filed at 06/10/2019 [id number] Gross per 24 hour  Intake 1644.8 ml  Output 925 ml  Net 719.8 ml   Filed Weights   06/07/19 2039  Weight: 63.9 kg    Examination:  General exam: NAD Respiratory system: CTA Cardiovascular system: S1-S2 regular rhythm and  rate Gastrointestinal system: BS present, soft, nt Central nervous system: Non focal.  Extremities: Symmetric power  Skin: Bilateral lower extremity with ulcers redness, worse left lower extremity.  Redness has improved slightly   Data Reviewed: I have personally reviewed following labs and imaging studies  CBC: Recent Labs  Lab 06/07/19 2032 06/08/19 0629 06/09/19 0727 06/10/19 0318  WBC 4.7 4.7 5.2 6.0  HGB 9.6* 9.1* 9.3* 9.3*  HCT 28.9* 28.4* 29.3* 28.9*  MCV 92.6 94.0 94.5 94.4  PLT 244 257 264 0000000   Basic Metabolic Panel: Recent Labs  Lab 06/07/19 2032 06/08/19 0629 06/10/19 0318  NA 135 137 137  K 4.1 3.8 3.5  CL 105 102 106  CO2 23 24 23   GLUCOSE 104* 88 96  BUN 11 9 9   CREATININE 0.68 0.77 0.77  CALCIUM 8.0* 8.1* 8.0*   GFR: Estimated Creatinine Clearance: 55.5 mL/min (by C-G formula based on SCr of 0.77 mg/dL). Liver Function Tests: No results for input(s): AST, ALT, ALKPHOS, BILITOT, PROT, ALBUMIN in the last 168 hours. No results for input(s): LIPASE, AMYLASE in the last 168 hours. No results for input(s): AMMONIA in the last 168 hours. Coagulation Profile: Recent Labs  Lab 06/08/19 0629  INR 1.1   Cardiac Enzymes: No results for input(s): CKTOTAL, CKMB, CKMBINDEX, TROPONINI in the last 168 hours. BNP (last 3 results) No results for input(s): PROBNP in the last 8760 hours. HbA1C: Recent Labs    06/08/19 1139  HGBA1C 5.5   CBG: No results for input(s): GLUCAP in the last 168 hours. Lipid Profile: No results for input(s): CHOL, HDL, LDLCALC, TRIG, CHOLHDL, LDLDIRECT in the last 72 hours. Thyroid Function Tests: No results for input(s): TSH, T4TOTAL, FREET4, T3FREE, THYROIDAB in the last 72 hours. Anemia Panel: No results for input(s): VITAMINB12, FOLATE, FERRITIN, TIBC, IRON, RETICCTPCT in the last 72 hours. Sepsis Labs: No results for input(s): PROCALCITON, LATICACIDVEN in the last 168 hours.  No results found for this or any previous  visit (from the past 240 hour(s)).       Radiology Studies: Ir Angiogram Extremity Left  Result Date: 06/09/2019 INDICATION: 85 year old male with left lower extremity critical limb ischemia ABI 06/05/2019 Right: 0.93 Left: 0.68 EXAM: ULTRASOUND GUIDED ACCESS RIGHT COMMON FEMORAL ARTERY LEFT LOWER EXTREMITY ANGIOGRAM TREATMENT OF TANDEM CRITICAL STENOSIS OF LEFT SFA WITH DIRECTIONAL ATHERECTOMY AND DRUG-ELUTING BALLOON, AS WELL AS DRUG-ELUTING BALLOON ANGIO-SEAL FOR HEMOSTASIS MEDICATIONS: 7000 units IV heparin, 50 mg IV protamine sulfate ANESTHESIA/SEDATION: Moderate (conscious) sedation was employed during this procedure. A total of Versed 1.0 mg and Fentanyl 25 mcg was administered intravenously. Moderate Sedation Time: 60 minutes. The patient's level of consciousness and vital signs were monitored continuously by radiology nursing throughout the procedure under my direct supervision. CONTRAST:  70 cc visit base FLUOROSCOPY TIME:  Fluoroscopy Time: 9 minutes 18 seconds (40 mGy). COMPLICATIONS: None PROCEDURE: Informed consent was obtained from the patient following explanation of the procedure, risks, benefits and alternatives. The patient understands, agrees and consents for the procedure. All questions were addressed. A time out was performed prior to the initiation of the procedure. Maximal barrier sterile technique utilized including caps, mask, sterile gowns, sterile gloves,  large sterile drape, hand hygiene, and Betadine prep. Ultrasound survey of the right inguinal region was performed with images stored and sent to PACs, confirming patency of the vessel. 1% lidocaine used for local anesthesia. Small stab incision was made with 11 blade scalpel. Blunt dissection was performed under ultrasound. A micropuncture needle was used access the right common femoral artery under ultrasound. With excellent arterial blood flow returned, and an .018 micro wire was passed through the needle, observed enter the  abdominal aorta under fluoroscopy. The needle was removed, and a micropuncture sheath was placed over the wire. The inner dilator and wire were removed, and an 035 Bentson wire was advanced under fluoroscopy into the abdominal aorta. The sheath was removed and a standard 5 [place] vascular sheath was placed. The dilator was removed and the sheath was flushed. Four French omni Flush sheath was advanced to the bifurcation. Bentson wire and a Glidewire were used to navigate into the distal external iliac artery. Omni Flush sheath was advanced to the distal external iliac artery. Rose in wire was placed into the common femoral artery. Six [place] destination sheath was then placed over the bifurcation. Angiogram of the left lower extremity was performed. IV heparinization was performed with 7000 units. A Glidewire and vertebral catheter we were used to navigate through the proximal critical stenosis and into the distal SFA. A 7 mm spider wire protection device was deployed. Directional atherectomy was performed. Drug-eluting balloon angioplasty was then performed at the proximal SFA with a 6 mm x 60 mm impact balloon. Angiogram was performed. The protection device had migrated proximally. We used the retrieval device to reposition beyond the more distal critical stenosis in the adductor canal. With the protection device redeployed, standard balloon angioplasty was performed at the second treatment site, 5 mm by 40 mm. We then elected to treat this site with a 5 mm by 60 mm impact balloon. Angiogram was performed confirming excellent flow. The protection device was retrieved through the 035 balloon and the balloon was removed with the protection device. Final angiogram was performed of the left lower extremity. Angio-Seal was deployed at the right common femoral artery. Pulses confirmed at the left foot and the right foot. Patient tolerated procedure well and remained hemodynamically stable throughout. No complications  were encountered and no significant blood loss. FINDINGS: Proximal SFA lesion greater than 90% stenosis pre treatment Proximal SFA lesion with no residual stenosis post treatment Distal SFA lesion with 80% stenosis pre treatment Distal SFA lesion with no residual stenosis post treatment IMPRESSION: Status post ultrasound guided access right common femoral artery for left lower extremity angiogram and treatment of tandem high-grade stenosis of the superficial femoral artery with directional atherectomy/drug-eluting balloon, and PTA/drug-eluting balloon restoring excellent flow with no residual stenosis. Angio-Seal for hemostasis. Signed, Dulcy Fanny. Dellia Nims, RPVI [facility] Radiology Electronically Signed   By: Corrie Mckusick D.O.   On: 06/09/2019 10:49   Ir Angiogram Selective Each Additional Vessel  Result Date: 06/09/2019 INDICATION: 85 year old male with left lower extremity critical limb ischemia ABI 06/05/2019 Right: 0.93 Left: 0.68 EXAM: ULTRASOUND GUIDED ACCESS RIGHT COMMON FEMORAL ARTERY LEFT LOWER EXTREMITY ANGIOGRAM TREATMENT OF TANDEM CRITICAL STENOSIS OF LEFT SFA WITH DIRECTIONAL ATHERECTOMY AND DRUG-ELUTING BALLOON, AS WELL AS DRUG-ELUTING BALLOON ANGIO-SEAL FOR HEMOSTASIS MEDICATIONS: 7000 units IV heparin, 50 mg IV protamine sulfate ANESTHESIA/SEDATION: Moderate (conscious) sedation was employed during this procedure. A total of Versed 1.0 mg and Fentanyl 25 mcg was administered intravenously. Moderate Sedation  Time: 60 minutes. The patient's level of consciousness and vital signs were monitored continuously by radiology nursing throughout the procedure under my direct supervision. CONTRAST:  70 cc visit base FLUOROSCOPY TIME:  Fluoroscopy Time: 9 minutes 18 seconds (40 mGy). COMPLICATIONS: None PROCEDURE: Informed consent was obtained from the patient following explanation of the procedure, risks, benefits and alternatives. The patient  understands, agrees and consents for the procedure. All questions were addressed. A time out was performed prior to the initiation of the procedure. Maximal barrier sterile technique utilized including caps, mask, sterile gowns, sterile gloves, large sterile drape, hand hygiene, and Betadine prep. Ultrasound survey of the right inguinal region was performed with images stored and sent to PACs, confirming patency of the vessel. 1% lidocaine used for local anesthesia. Small stab incision was made with 11 blade scalpel. Blunt dissection was performed under ultrasound. A micropuncture needle was used access the right common femoral artery under ultrasound. With excellent arterial blood flow returned, and an .018 micro wire was passed through the needle, observed enter the abdominal aorta under fluoroscopy. The needle was removed, and a micropuncture sheath was placed over the wire. The inner dilator and wire were removed, and an 035 Bentson wire was advanced under fluoroscopy into the abdominal aorta. The sheath was removed and a standard 5 [place] vascular sheath was placed. The dilator was removed and the sheath was flushed. Four French omni Flush sheath was advanced to the bifurcation. Bentson wire and a Glidewire were used to navigate into the distal external iliac artery. Omni Flush sheath was advanced to the distal external iliac artery. Rose in wire was placed into the common femoral artery. Six [place] destination sheath was then placed over the bifurcation. Angiogram of the left lower extremity was performed. IV heparinization was performed with 7000 units. A Glidewire and vertebral catheter we were used to navigate through the proximal critical stenosis and into the distal SFA. A 7 mm spider wire protection device was deployed. Directional atherectomy was performed. Drug-eluting balloon angioplasty was then performed at the proximal SFA with a 6 mm x 60 mm impact balloon. Angiogram was performed. The protection  device had migrated proximally. We used the retrieval device to reposition beyond the more distal critical stenosis in the adductor canal. With the protection device redeployed, standard balloon angioplasty was performed at the second treatment site, 5 mm by 40 mm. We then elected to treat this site with a 5 mm by 60 mm impact balloon. Angiogram was performed confirming excellent flow. The protection device was retrieved through the 035 balloon and the balloon was removed with the protection device. Final angiogram was performed of the left lower extremity. Angio-Seal was deployed at the right common femoral artery. Pulses confirmed at the left foot and the right foot. Patient tolerated procedure well and remained hemodynamically stable throughout. No complications were encountered and no significant blood loss. FINDINGS: Proximal SFA lesion greater than 90% stenosis pre treatment Proximal SFA lesion with no residual stenosis post treatment Distal SFA lesion with 80% stenosis pre treatment Distal SFA lesion with no residual stenosis post treatment IMPRESSION: Status post ultrasound guided access right common femoral artery for left lower extremity angiogram and treatment of tandem high-grade stenosis of the superficial femoral artery with directional atherectomy/drug-eluting balloon, and PTA/drug-eluting balloon restoring excellent flow with no residual stenosis. Angio-Seal for hemostasis. Signed, Dulcy Fanny. Dellia Nims, RPVI [facility] Radiology Electronically Signed   By: Corrie Mckusick D.O.   On: 06/09/2019  10:49   Edgar Pta Mod Sed  Result Date: 06/09/2019 INDICATION: 85 year old male with left lower extremity critical limb ischemia ABI 06/05/2019 Right: 0.93 Left: 0.68 EXAM: ULTRASOUND GUIDED ACCESS RIGHT COMMON FEMORAL ARTERY LEFT LOWER EXTREMITY ANGIOGRAM TREATMENT OF TANDEM CRITICAL STENOSIS OF LEFT SFA WITH DIRECTIONAL ATHERECTOMY AND  DRUG-ELUTING BALLOON, AS WELL AS DRUG-ELUTING BALLOON ANGIO-SEAL FOR HEMOSTASIS MEDICATIONS: 7000 units IV heparin, 50 mg IV protamine sulfate ANESTHESIA/SEDATION: Moderate (conscious) sedation was employed during this procedure. A total of Versed 1.0 mg and Fentanyl 25 mcg was administered intravenously. Moderate Sedation Time: 60 minutes. The patient's level of consciousness and vital signs were monitored continuously by radiology nursing throughout the procedure under my direct supervision. CONTRAST:  70 cc visit base FLUOROSCOPY TIME:  Fluoroscopy Time: 9 minutes 18 seconds (40 mGy). COMPLICATIONS: None PROCEDURE: Informed consent was obtained from the patient following explanation of the procedure, risks, benefits and alternatives. The patient understands, agrees and consents for the procedure. All questions were addressed. A time out was performed prior to the initiation of the procedure. Maximal barrier sterile technique utilized including caps, mask, sterile gowns, sterile gloves, large sterile drape, hand hygiene, and Betadine prep. Ultrasound survey of the right inguinal region was performed with images stored and sent to PACs, confirming patency of the vessel. 1% lidocaine used for local anesthesia. Small stab incision was made with 11 blade scalpel. Blunt dissection was performed under ultrasound. A micropuncture needle was used access the right common femoral artery under ultrasound. With excellent arterial blood flow returned, and an .018 micro wire was passed through the needle, observed enter the abdominal aorta under fluoroscopy. The needle was removed, and a micropuncture sheath was placed over the wire. The inner dilator and wire were removed, and an 035 Bentson wire was advanced under fluoroscopy into the abdominal aorta. The sheath was removed and a standard 5 [place] vascular sheath was placed. The dilator was removed and the sheath was flushed. Four French omni Flush sheath was advanced to the  bifurcation. Bentson wire and a Glidewire were used to navigate into the distal external iliac artery. Omni Flush sheath was advanced to the distal external iliac artery. Rose in wire was placed into the common femoral artery. Six [place] destination sheath was then placed over the bifurcation. Angiogram of the left lower extremity was performed. IV heparinization was performed with 7000 units. A Glidewire and vertebral catheter we were used to navigate through the proximal critical stenosis and into the distal SFA. A 7 mm spider wire protection device was deployed. Directional atherectomy was performed. Drug-eluting balloon angioplasty was then performed at the proximal SFA with a 6 mm x 60 mm impact balloon. Angiogram was performed. The protection device had migrated proximally. We used the retrieval device to reposition beyond the more distal critical stenosis in the adductor canal. With the protection device redeployed, standard balloon angioplasty was performed at the second treatment site, 5 mm by 40 mm. We then elected to treat this site with a 5 mm by 60 mm impact balloon. Angiogram was performed confirming excellent flow. The protection device was retrieved through the 035 balloon and the balloon was removed with the protection device. Final angiogram was performed of the left lower extremity. Angio-Seal was deployed at the right common femoral artery. Pulses confirmed at the left foot and the right foot. Patient tolerated procedure well and remained hemodynamically stable throughout. No complications were encountered and no significant blood loss. FINDINGS: Proximal SFA lesion greater  than 90% stenosis pre treatment Proximal SFA lesion with no residual stenosis post treatment Distal SFA lesion with 80% stenosis pre treatment Distal SFA lesion with no residual stenosis post treatment IMPRESSION: Status post ultrasound guided access right common femoral artery for left lower extremity angiogram and treatment  of tandem high-grade stenosis of the superficial femoral artery with directional atherectomy/drug-eluting balloon, and PTA/drug-eluting balloon restoring excellent flow with no residual stenosis. Angio-Seal for hemostasis. Signed, Dulcy Fanny. Dellia Nims, RPVI [facility] Inter[facility] Radiology Specialists [facility] Radiology Electronically Signed   By: Corrie Mckusick D.O.   On: 06/09/2019 10:49   Ir US Guide Vasc Access Right  Result Date: 06/09/2019 INDICATION: 85 year old male with left lower extremity critical limb ischemia ABI 06/05/2019 Right: 0.93 Left: 0.68 EXAM: ULTRASOUND GUIDED ACCESS RIGHT COMMON FEMORAL ARTERY LEFT LOWER EXTREMITY ANGIOGRAM TREATMENT OF TANDEM CRITICAL STENOSIS OF LEFT SFA WITH DIRECTIONAL ATHERECTOMY AND DRUG-ELUTING BALLOON, AS WELL AS DRUG-ELUTING BALLOON ANGIO-SEAL FOR HEMOSTASIS MEDICATIONS: 7000 units IV heparin, 50 mg IV protamine sulfate ANESTHESIA/SEDATION: Moderate (conscious) sedation was employed during this procedure. A total of Versed 1.0 mg and Fentanyl 25 mcg was administered intravenously. Moderate Sedation Time: 60 minutes. The patient's level of consciousness and vital signs were monitored continuously by radiology nursing throughout the procedure under my direct supervision. CONTRAST:  70 cc visit base FLUOROSCOPY TIME:  Fluoroscopy Time: 9 minutes 18 seconds (40 mGy). COMPLICATIONS: None PROCEDURE: Informed consent was obtained from the patient following explanation of the procedure, risks, benefits and alternatives. The patient understands, agrees and consents for the procedure. All questions were addressed. A time out was performed prior to the initiation of the procedure. Maximal barrier sterile technique utilized including caps, mask, sterile gowns, sterile gloves, large sterile drape, hand hygiene, and Betadine prep. Ultrasound survey of the right inguinal region was performed with images stored and sent to PACs, confirming patency of the vessel. 1%  lidocaine used for local anesthesia. Small stab incision was made with 11 blade scalpel. Blunt dissection was performed under ultrasound. A micropuncture needle was used access the right common femoral artery under ultrasound. With excellent arterial blood flow returned, and an .018 micro wire was passed through the needle, observed enter the abdominal aorta under fluoroscopy. The needle was removed, and a micropuncture sheath was placed over the wire. The inner dilator and wire were removed, and an 035 Bentson wire was advanced under fluoroscopy into the abdominal aorta. The sheath was removed and a standard 5 [place] vascular sheath was placed. The dilator was removed and the sheath was flushed. Four French omni Flush sheath was advanced to the bifurcation. Bentson wire and a Glidewire were used to navigate into the distal external iliac artery. Omni Flush sheath was advanced to the distal external iliac artery. Rose in wire was placed into the common femoral artery. Six [place] destination sheath was then placed over the bifurcation. Angiogram of the left lower extremity was performed. IV heparinization was performed with 7000 units. A Glidewire and vertebral catheter we were used to navigate through the proximal critical stenosis and into the distal SFA. A 7 mm spider wire protection device was deployed. Directional atherectomy was performed. Drug-eluting balloon angioplasty was then performed at the proximal SFA with a 6 mm x 60 mm impact balloon. Angiogram was performed. The protection device had migrated proximally. We used the retrieval device to reposition beyond the more distal critical stenosis in the adductor canal. With the protection device redeployed, standard balloon angioplasty was performed at the second treatment  site, 5 mm by 40 mm. We then elected to treat this site with a 5 mm by 60 mm impact balloon. Angiogram was performed confirming excellent flow. The protection device was retrieved through  the 035 balloon and the balloon was removed with the protection device. Final angiogram was performed of the left lower extremity. Angio-Seal was deployed at the right common femoral artery. Pulses confirmed at the left foot and the right foot. Patient tolerated procedure well and remained hemodynamically stable throughout. No complications were encountered and no significant blood loss. FINDINGS: Proximal SFA lesion greater than 90% stenosis pre treatment Proximal SFA lesion with no residual stenosis post treatment Distal SFA lesion with 80% stenosis pre treatment Distal SFA lesion with no residual stenosis post treatment IMPRESSION: Status post ultrasound guided access right common femoral artery for left lower extremity angiogram and treatment of tandem high-grade stenosis of the superficial femoral artery with directional atherectomy/drug-eluting balloon, and PTA/drug-eluting balloon restoring excellent flow with no residual stenosis. Angio-Seal for hemostasis. Signed, Dulcy Fanny. Dellia Nims, RPVI [facility] Radiology Electronically Signed   By: Corrie Mckusick D.O.   On: 06/09/2019 10:49        Scheduled Meds:  aspirin EC  81 mg Oral Daily   clopidogrel  75 mg Oral Daily   collagenase   Topical Daily   feeding supplement (ENSURE ENLIVE)  237 mL Oral BID BM   ferrous sulfate  325 mg Oral Q breakfast   levothyroxine  50 mcg Oral Q0600   multivitamin with minerals  1 tablet Oral Daily   protamine  50 mg Intravenous Once   senna-docusate  1 tablet Oral QHS   Continuous Infusions:  sodium chloride 1,000 mL (06/10/19 1039)   cefTRIAXone (ROCEPHIN)  IV 2 g (06/10/19 1053)   metronidazole 500 mg (06/10/19 1040)   vancomycin Stopped (06/09/19 1912)     LOS: 3 days    Time spent: 35 minutes    Orlondo Holycross A Marrissa Dai, MD [facility]  If 7PM-7AM, please contact night-coverage [email or web address] Password [facility] 06/10/2019, 12:07 PM

## 2019-06-10 NOTE — Progress Notes (Signed)
IR PA to bedside for post-procedure evaluation.   Patient resting comfortably with legs elevated.   Bedsheets are notably soiled with bloody fluid.  Patient reports he stood up to get to bedside commode and a wound on his left heel opened causing bleeding.  None since.  Ordering lunch at time of visit.  Has no questions.   Continues Plavix 75mg , aspirin 81mg  daily.   Follow-up plans in place per Dr. Earleen [place].   Working with PT for disposition planning.    Brynda Greathouse, MS RD PA-C

## 2019-06-11 MED ORDER — DOXYCYCLINE HYCLATE 100 MG PO TABS
100.0000 mg | ORAL_TABLET | Freq: Two times a day (BID) | ORAL | Status: DC
  Administered 2019-06-11 – 2019-06-13 (×5): 100 mg via ORAL
  Filled 2019-06-11 (×5): qty 1

## 2019-06-11 MED ORDER — CEPHALEXIN 500 MG PO CAPS
500.0000 mg | ORAL_CAPSULE | Freq: Three times a day (TID) | ORAL | Status: DC
  Administered 2019-06-11 – 2019-06-13 (×6): 500 mg via ORAL
  Filled 2019-06-11 (×6): qty 1

## 2019-06-11 MED ORDER — POLYETHYLENE GLYCOL 3350 17 G PO PACK
17.0000 g | PACK | Freq: Every day | ORAL | Status: DC
  Administered 2019-06-11 – 2019-06-13 (×3): 17 g via ORAL
  Filled 2019-06-11 (×3): qty 1

## 2019-06-11 NOTE — Plan of Care (Signed)

## 2019-06-11 NOTE — Progress Notes (Signed)
  Speech Language Pathology Treatment: Dysphagia  Patient Details Name: Antonio Estes. MRN: [id number] DOB: 1927/04/16 Today's Date: 06/11/2019 Time: [id number] SLP Time Calculation (min) (ACUTE ONLY): 15 min  Assessment / Plan / Recommendation Clinical Impression  Pt seen for skilled ST intervention targeting goals for safe swallow and tolerance of least restrictive diet. Pt was seated in recliner, awake and alert. Nursing present. Pt reports no difficulty swallowing, and did not exhibit overt s/s aspiration following trials of thin liquid, puree, and soft solids. Nursing reports plan for pt to discharge tomorrow to rehab. Recommend follow up at next level for continued education and assessment of diet tolerance, given increased aspiration risk.    HPI HPI: 85 y.o. male with medical history significant of peripheral vascular disease, chronic bilateral lower extremity wounds followed by home health and outpatient wound care center who presented to ED at [facility] on 11/13 following a fall 2 days prior due to generalized weakness. He was subsequently found to have left lower extremity ischemia. Transferred to [facility] for tentative aorta peripheral angiogram and possible treatment of left lower extremity. Incidential finding of aspiration pnemonia.       SLP Plan  Continue with current plan of care       Recommendations  Diet recommendations: Dysphagia 3 (mechanical soft);Thin liquid Liquids provided via: Cup;Straw Medication Administration: Crushed with puree Supervision: Patient able to self feed Compensations: Minimize environmental distractions;Slow rate;Small sips/bites Postural Changes and/or Swallow Maneuvers: Seated upright 90 degrees                Oral Care Recommendations: Oral care BID Follow up Recommendations: Skilled Nursing facility SLP Visit Diagnosis: Dysphagia, unspecified (R13.10) Plan: Continue with current plan of care       [place], [facility] [place] Pathologist Office: [phone] Pager: [phone]  Shonna Chock 06/11/2019, 12:51 PM

## 2019-06-11 NOTE — Progress Notes (Signed)
PROGRESS NOTE    Antonio Estes.  [id number] DOB: Jan 29, 1927 DOA: 06/07/2019 PCP: Imagene Riches, NP   Brief Narrative: 85 year old with past medical history significant for peripheral vascular disease, chronic bilateral lower extremity wounds followed by home health and outpatient wound care center who presented to [facility] on 11/13 following a fall 2 days prior to admission due to generalized weakness.  Patient denies any loss of consciousness alert head injury. He was subsequently found to have left lower extremity ischemia.  ABIs show resting ABI of the left extremity in the moderate range arterial occlusive disease and the femoral-popliteal segment and bilateral arteries.  CTA aortic with femoral runoff shows several areas of progressive disease.  Patient was evaluated by Dr. Earleen [place] with IR at [facility] who recommended patient to be transferred to [facility] for tentative aorto peripheral angiogram and possible treatment of left lower extremity. Anticoagulation with heparin was started. He was started on augmenti, for his legs wounds and also coverage of incidental finding of aspiration PNA.    Assessment & Plan:   Active Problems:   Critical lower limb ischemia   Normocytic anemia   PVD (peripheral vascular disease) (HCC)   Failure to thrive in adult   Protein-calorie malnutrition, severe  1-Acute ischemia superimposed on peripheral vascular disease with chronic bilateral lower extremity wounds: superimpose cellulitis.  Patient still with significant redness of lower extremity, malodorous.  Continue with IV vancomycin and ceftriaxone and Flagyl. Discussed with Dr Earleen [place], patient doesn't need Heparin Gtt post procedure. He was loaded with plavix. Plan to continue with aspirin 81 mg daily and plavix 75 mg daily.  IV morphine and tramadol PRN.  S/P LLE angiogram, treatment of tandem high grade 90 % stenosis of the SFA with atherectomy/DEB, and PTA /DEB with  excellent angiographic outcome.  Will change to oral keflex and doxy for 7 days.  Needs PT evaluation. Needs SNF for rehab He was bleeding from one of the ulcer LE> resolved  Failure to thrive: PT nutritionist consulted.  I have order Ensure.  Severe protein caloric malnutrition: Added Ensure. Prealbumin 13.2.  aspiration pneumonia: On IV antibiotics.  Speech therapy consult  Hyperkalemia outside facility labs. resolved.  Normocytic anemia: Iron deficiency anemia: Continue with iron supplement. Hypothyroidism: Continue with Synthroid.  Left indirect inguinal hernia; -Incidental finding on the CTA.  No abdominal complaints.  Monitor      Estimated body mass index is 16.71 kg/m as calculated from the following:   Height as of this encounter: 6\' 5"  (1.956 m).   Weight as of this encounter: 63.9 kg.   DVT prophylaxis: Heparin drip Code Status: Full code Family Communication: Discussed with patient Disposition Plan:  Needs SNF for wound care and rehab Consultants:  IR  Procedures:   None  Antimicrobials:  Vancomycin, ceftriaxone and Flagyl  Subjective: He report pain is controlled.  He had small BM yesterday. He is asking for laxatives.     Objective: Vitals:   06/10/19 1319 06/10/19 2052 06/11/19 0439 06/11/19 0900  BP: 114/67 (!) 114/58 (!) 105/55 105/60  Pulse: 89 72 73 78  Resp: 18 18 18 17   Temp: 99.1 F (37.3 C) 98 F (36.7 C) 97.7 F (36.5 C) 97.7 F (36.5 C)  TempSrc: Oral Oral Oral Oral  SpO2: 94% 97% 95% 97%  Weight:      Height:        Intake/Output Summary (Last 24 hours) at 06/11/2019 1147 Last data filed at 06/11/2019 0546 Gross per 24  hour  Intake 1448.72 ml  Output 130 ml  Net 1318.72 ml   Filed Weights   06/07/19 2039  Weight: 63.9 kg    Examination:  General exam: NAD Respiratory system: CTA Cardiovascular system: S , 1 S 2 RRR Gastrointestinal system: BS present, soft, nt Central nervous system: Non focal.   Extremities: Symmetric power.  Skin: Bilateral LE with multiples ulcer, redness improving.    Data Reviewed: I have personally reviewed following labs and imaging studies  CBC: Recent Labs  Lab 06/07/19 2032 06/08/19 0629 06/09/19 0727 06/10/19 0318  WBC 4.7 4.7 5.2 6.0  HGB 9.6* 9.1* 9.3* 9.3*  HCT 28.9* 28.4* 29.3* 28.9*  MCV 92.6 94.0 94.5 94.4  PLT 244 257 264 0000000   Basic Metabolic Panel: Recent Labs  Lab 06/07/19 2032 06/08/19 0629 06/10/19 0318  NA 135 137 137  K 4.1 3.8 3.5  CL 105 102 106  CO2 23 24 23   GLUCOSE 104* 88 96  BUN 11 9 9   CREATININE 0.68 0.77 0.77  CALCIUM 8.0* 8.1* 8.0*   GFR: Estimated Creatinine Clearance: 55.5 mL/min (by C-G formula based on SCr of 0.77 mg/dL). Liver Function Tests: No results for input(s): AST, ALT, ALKPHOS, BILITOT, PROT, ALBUMIN in the last 168 hours. No results for input(s): LIPASE, AMYLASE in the last 168 hours. No results for input(s): AMMONIA in the last 168 hours. Coagulation Profile: Recent Labs  Lab 06/08/19 0629  INR 1.1   Cardiac Enzymes: No results for input(s): CKTOTAL, CKMB, CKMBINDEX, TROPONINI in the last 168 hours. BNP (last 3 results) No results for input(s): PROBNP in the last 8760 hours. HbA1C: No results for input(s): HGBA1C in the last 72 hours. CBG: No results for input(s): GLUCAP in the last 168 hours. Lipid Profile: No results for input(s): CHOL, HDL, LDLCALC, TRIG, CHOLHDL, LDLDIRECT in the last 72 hours. Thyroid Function Tests: No results for input(s): TSH, T4TOTAL, FREET4, T3FREE, THYROIDAB in the last 72 hours. Anemia Panel: No results for input(s): VITAMINB12, FOLATE, FERRITIN, TIBC, IRON, RETICCTPCT in the last 72 hours. Sepsis Labs: No results for input(s): PROCALCITON, LATICACIDVEN in the last 168 hours.  Recent Results (from the past 240 hour(s))  SARS CORONAVIRUS 2 (TAT 6-24 HRS) Nasopharyngeal Nasopharyngeal Swab     Status: None   Collection Time: 06/10/19  4:55 PM    Specimen: Nasopharyngeal Swab  Result Value Ref Range Status   SARS Coronavirus 2 NEGATIVE NEGATIVE Final    Comment: (NOTE) SARS-CoV-2 target nucleic acids are NOT DETECTED. The SARS-CoV-2 RNA is generally detectable in upper and lower respiratory specimens during the acute phase of infection. Negative results do not preclude SARS-CoV-2 infection, do not rule out co-infections with other pathogens, and should not be used as the sole basis for treatment or other patient management decisions. Negative results must be combined with clinical observations, patient history, and epidemiological information. The expected result is Negative. Fact Sheet for Patients: SugarRoll.be Fact Sheet for Healthcare Providers: https://www.woods-mathews.com/ This test is not yet approved or cleared by the [place] FDA and  has been authorized for detection and/or diagnosis of SARS-CoV-2 by FDA under an Emergency Use Authorization (EUA). This EUA will remain  in effect (meaning this test can be used) for the duration of the COVID-19 declaration under Section 56 4(b)(1) of the Act, 21 U.S.C. section 360bbb-3(b)(1), unless the authorization is terminated or revoked sooner. Performed at [facility], [place] [address] [place], [place] [address]  Radiology Studies: No results found.      Scheduled Meds: . aspirin EC  81 mg Oral Daily  . cephALEXin  500 mg Oral Q8H  . clopidogrel  75 mg Oral Daily  . collagenase   Topical Daily  . doxycycline  100 mg Oral Q12H  . feeding supplement (ENSURE ENLIVE)  237 mL Oral BID BM  . ferrous sulfate  325 mg Oral Q breakfast  . levothyroxine  50 mcg Oral Q0600  . multivitamin with minerals  1 tablet Oral Daily  . polyethylene glycol  17 g Oral Daily  . protamine  50 mg Intravenous Once  . senna-docusate  1 tablet Oral QHS   Continuous Infusions: . sodium chloride 10 mL/hr at 06/11/19 0546      LOS: 4 days    Time spent: 35 minutes    Sophy Mesler A Jacksyn Beeks, MD [facility]  If 7PM-7AM, please contact night-coverage [email or web address] Password [facility] 06/11/2019, 11:47 AM

## 2019-06-12 DIAGNOSIS — E43 Unspecified severe protein-calorie malnutrition: Secondary | ICD-10-CM

## 2019-06-12 LAB — BASIC METABOLIC PANEL
Anion gap: 7 (ref 5–15)
BUN: 12 mg/dL (ref 8–23)
CO2: 25 mmol/L (ref 22–32)
Calcium: 8.3 mg/dL — ABNORMAL LOW (ref 8.9–10.3)
Chloride: 103 mmol/L (ref 98–111)
Creatinine, Ser: 0.91 mg/dL (ref 0.61–1.24)
GFR calc Af Amer: >60 mL/min (ref >60)
GFR calc non Af Amer: >60 mL/min (ref >60)
Glucose, Bld: 115 mg/dL — ABNORMAL HIGH (ref 70–99)
Potassium: 4 mmol/L (ref 3.5–5.1)
Sodium: 135 mmol/L (ref 135–145)

## 2019-06-12 LAB — CBC
HCT: 32.8 % — ABNORMAL LOW (ref 39.0–52.0)
Hemoglobin: 10.3 g/dL — ABNORMAL LOW (ref 13.0–17.0)
MCH: 30.3 pg (ref 26.0–34.0)
MCHC: 31.4 g/dL (ref 30.0–36.0)
MCV: 96.5 fL (ref 80.0–100.0)
Platelets: 310 10*3/uL (ref 150–400)
RBC: 3.4 MIL/uL — ABNORMAL LOW (ref 4.22–5.81)
RDW: 14.8 % (ref 11.5–15.5)
WBC: 7.9 10*3/uL (ref 4.0–10.5)
nRBC: 0 % (ref 0.0–0.2)

## 2019-06-12 LAB — SARS CORONAVIRUS 2 (TAT 6-24 HRS): SARS Coronavirus 2: NEGATIVE

## 2019-06-12 NOTE — Progress Notes (Signed)
PROGRESS NOTE    Antonio Estes.  [id number] DOB: 1926/08/06 DOA: 06/07/2019 PCP: Imagene Riches, NP   Brief Narrative: 85 year old with past medical history significant for peripheral vascular disease, chronic bilateral lower extremity wounds followed by home health and outpatient wound care center who presented to [facility] on 11/13 following a fall 2 days prior to admission due to generalized weakness.  Patient denies any loss of consciousness alert head injury. He was subsequently found to have left lower extremity ischemia.  ABIs show resting ABI of the left extremity in the moderate range arterial occlusive disease and the femoral-popliteal segment and bilateral arteries.  CTA aortic with femoral runoff shows several areas of progressive disease.  Patient was evaluated by Dr. Earleen [place] with IR at [facility] who recommended patient to be transferred to [facility] for tentative aorto peripheral angiogram and possible treatment of left lower extremity. Anticoagulation with heparin was started. He was started on augmenti, for his legs wounds and also coverage of incidental finding of aspiration PNA.    Assessment & Plan:   Active Problems:   Critical lower limb ischemia   Normocytic anemia   PVD (peripheral vascular disease) (HCC)   Failure to thrive in adult   Protein-calorie malnutrition, severe  1-Acute ischemia superimposed on peripheral vascular disease with chronic bilateral lower extremity wounds: superimpose cellulitis.  -Patient had  significant redness of lower extremity, malodorous. He was treated with IV vancomycin and ceftriaxone and Flagyl. Discussed with Dr Earleen [place], patient doesn't need Heparin Gtt post procedure. He was loaded with plavix. Plan to continue with aspirin 81 mg daily and plavix 75 mg daily.  IV morphine and tramadol PRN.  S/P LLE angiogram, treatment of tandem high grade 90 % stenosis of the SFA with atherectomy/DEB, and PTA /DEB with  excellent angiographic outcome.  Change to oral keflex and doxy for 7 days.  PT recommend SNF.  He was bleeding from one of the ulcer LE> resolved Awaiting rehab,. Continue with wound care.   Failure to thrive: PT nutritionist consulted.  I have order Ensure.  Severe protein caloric malnutrition: Added Ensure. Prealbumin 13.2.  aspiration pneumonia: On IV antibiotics.  Speech therapy consult  Hyperkalemia outside facility labs. resolved.  Normocytic anemia: Iron deficiency anemia: Continue with iron supplement. Hypothyroidism: Continue with Synthroid.  Left indirect inguinal hernia; -Incidental finding on the CTA.  No abdominal complaints.  Monitor      Estimated body mass index is 16.71 kg/m as calculated from the following:   Height as of this encounter: 6\' 5"  (1.956 m).   Weight as of this encounter: 63.9 kg.   DVT prophylaxis: Heparin drip Code Status: Full code Family Communication: Discussed with patient Disposition Plan:  Needs SNF for wound care and rehab Consultants:  IR  Procedures:   None  Antimicrobials:  Vancomycin, ceftriaxone and Flagyl  Subjective: He report pain is controlled. Wound le improving.  He is looking forward to go to rehab to start rehabilitation, to be able to go home in the futurwe.   Objective: Vitals:   06/11/19 0900 06/11/19 2054 06/12/19 0413 06/12/19 1428  BP: 105/60 119/60 140/82 127/72  Pulse: 78 65 81 71  Resp: 17 18 18 17   Temp: 97.7 F (36.5 C) 97.9 F (36.6 C) 98.1 F (36.7 C) 98.2 F (36.8 C)  TempSrc: Oral Oral Oral Oral  SpO2: 97% 95% 92% 97%  Weight:      Height:        Intake/Output Summary (Last 24  hours) at 06/12/2019 1452 Last data filed at 06/12/2019 1000 Gross per 24 hour  Intake 120 ml  Output 625 ml  Net -505 ml   Filed Weights   06/07/19 2039  Weight: 63.9 kg    Examination:  General exam: NAD Respiratory system: CTA Cardiovascular system: S 1, S2 RRR Gastrointestinal system: BS  present, soft, nt, nd Central nervous system: Non focal.  Extremities: Symmetric power.  Skin: Multiples ulcer B/L LE    Data Reviewed: I have personally reviewed following labs and imaging studies  CBC: Recent Labs  Lab 06/07/19 2032 06/08/19 0629 06/09/19 0727 06/10/19 0318  WBC 4.7 4.7 5.2 6.0  HGB 9.6* 9.1* 9.3* 9.3*  HCT 28.9* 28.4* 29.3* 28.9*  MCV 92.6 94.0 94.5 94.4  PLT 244 257 264 0000000   Basic Metabolic Panel: Recent Labs  Lab 06/07/19 2032 06/08/19 0629 06/10/19 0318  NA 135 137 137  K 4.1 3.8 3.5  CL 105 102 106  CO2 23 24 23   GLUCOSE 104* 88 96  BUN 11 9 9   CREATININE 0.68 0.77 0.77  CALCIUM 8.0* 8.1* 8.0*   GFR: Estimated Creatinine Clearance: 55.5 mL/min (by C-G formula based on SCr of 0.77 mg/dL). Liver Function Tests: No results for input(s): AST, ALT, ALKPHOS, BILITOT, PROT, ALBUMIN in the last 168 hours. No results for input(s): LIPASE, AMYLASE in the last 168 hours. No results for input(s): AMMONIA in the last 168 hours. Coagulation Profile: Recent Labs  Lab 06/08/19 0629  INR 1.1   Cardiac Enzymes: No results for input(s): CKTOTAL, CKMB, CKMBINDEX, TROPONINI in the last 168 hours. BNP (last 3 results) No results for input(s): PROBNP in the last 8760 hours. HbA1C: No results for input(s): HGBA1C in the last 72 hours. CBG: No results for input(s): GLUCAP in the last 168 hours. Lipid Profile: No results for input(s): CHOL, HDL, LDLCALC, TRIG, CHOLHDL, LDLDIRECT in the last 72 hours. Thyroid Function Tests: No results for input(s): TSH, T4TOTAL, FREET4, T3FREE, THYROIDAB in the last 72 hours. Anemia Panel: No results for input(s): VITAMINB12, FOLATE, FERRITIN, TIBC, IRON, RETICCTPCT in the last 72 hours. Sepsis Labs: No results for input(s): PROCALCITON, LATICACIDVEN in the last 168 hours.  Recent Results (from the past 240 hour(s))  SARS CORONAVIRUS 2 (TAT 6-24 HRS) Nasopharyngeal Nasopharyngeal Swab     Status: None   Collection  Time: 06/10/19  4:55 PM   Specimen: Nasopharyngeal Swab  Result Value Ref Range Status   SARS Coronavirus 2 NEGATIVE NEGATIVE Final    Comment: (NOTE) SARS-CoV-2 target nucleic acids are NOT DETECTED. The SARS-CoV-2 RNA is generally detectable in upper and lower respiratory specimens during the acute phase of infection. Negative results do not preclude SARS-CoV-2 infection, do not rule out co-infections with other pathogens, and should not be used as the sole basis for treatment or other patient management decisions. Negative results must be combined with clinical observations, patient history, and epidemiological information. The expected result is Negative. Fact Sheet for Patients: SugarRoll.be Fact Sheet for Healthcare Providers: https://www.woods-mathews.com/ This test is not yet approved or cleared by the [place] FDA and  has been authorized for detection and/or diagnosis of SARS-CoV-2 by FDA under an Emergency Use Authorization (EUA). This EUA will remain  in effect (meaning this test can be used) for the duration of the COVID-19 declaration under Section 56 4(b)(1) of the Act, 21 U.S.C. section 360bbb-3(b)(1), unless the authorization is terminated or revoked sooner. Performed at [facility], [place] [address], [place], [place]  [place]          Radiology Studies: No results found.      Scheduled Meds: . aspirin EC  81 mg Oral Daily  . cephALEXin  500 mg Oral Q8H  . clopidogrel  75 mg Oral Daily  . collagenase   Topical Daily  . doxycycline  100 mg Oral Q12H  . feeding supplement (ENSURE ENLIVE)  237 mL Oral BID BM  . ferrous sulfate  325 mg Oral Q breakfast  . levothyroxine  50 mcg Oral Q0600  . multivitamin with minerals  1 tablet Oral Daily  . polyethylene glycol  17 g Oral Daily  . senna-docusate  1 tablet Oral QHS   Continuous Infusions: . sodium chloride 10 mL/hr at 06/11/19 0546     LOS: 5 days     Time spent: 35 minutes    Texanna Hilburn A Navia Lindahl, MD [facility]  If 7PM-7AM, please contact night-coverage [email or web address] Password [email or web address] 06/12/2019, 2:52 PM

## 2019-06-12 NOTE — TOC Progression Note (Addendum)
Transition of Care (TOC) - Progression Note    Patient Details  Name: Antonio Estes. MRN: [id number] Date of Birth: 12/02/1926  Transition of Care [facility]) CM/SW [place], [place] Phone Number: 06/12/2019, 10:44 AM  Clinical Narrative:    11:25am- CSW spoke with Karena Addison at [facility], facility has a bed available for pt pending insurance approval. Also spoke with Dr. Tyrell Antonio and she will order new COVID swab.   10:44am- CSW initiated [facility]ochemist, clinical through [employer]. Pt has two offers available, CMS list was provided to pt at this time with two specific offers. Pt prefers to be in [place] and has selected [place]. CSW explained insurance auth process again and that we would be requesting a new COVID swab. Pt declined for CSW to reach out to update his contacts at this time.   CSW will requst new COVID for pt from [place]. CSW has reached out to admissions at [facility] to assess bed availability.    Expected Discharge Plan: Skilled Nursing Facility Barriers to Discharge: Ship broker, Continued Medical Work up  Expected Discharge Plan and Services Expected Discharge Plan: [place] In-house Referral: Clinical Social Work Discharge Planning Services: NA Post Acute Care Choice: [place] Living arrangements for the past 2 months: Single Family Home         DME Arranged: N/A  HH Arranged: NA   Social Determinants of Health (SDOH) Interventions    Readmission Risk Interventions No flowsheet data found.

## 2019-06-12 NOTE — Progress Notes (Signed)
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 Earleen Newport. Patient laying in bed resting comfortably. He responds to voice and answers questions appropriately. No complaints. Right groin incision c/d/i.   Allergies: Patient has no known allergies.  Medications: Prior to Admission medications   Medication Sig Start Date End Date Taking? Authorizing Provider  fluticasone (FLONASE) 50 MCG/ACT nasal spray Place 1 spray into both nostrils daily.   Yes [provider]  levothyroxine (EUTHYROX) 50 MCG tablet Take 50 mcg by mouth daily before breakfast.   Yes [provider]     Vital Signs: BP 140/82 (BP Location: Left Arm)    Pulse 81    Temp 98.1 F (36.7 C) (Oral)    Resp 18    Ht 6\' 5"  (1.956 m)    Wt 140 lb 14 oz (63.9 kg)    SpO2 92%    BMI 16.71 kg/m   Physical Exam Vitals signs and nursing note reviewed.  Constitutional:      General: He is not in acute distress.    Appearance: Normal appearance.  Pulmonary:     Effort: Pulmonary effort is normal. No respiratory distress.  Skin:    General: Skin is warm and dry.     Comments: Right groin incision soft without active bleeding or hematoma. Bilateral lower extremities with chronic insufficiency ulcers and surrounding erythema, well dressed, appears healing.  Neurological:     Mental Status: He is alert.     Imaging: Ir Angiogram Extremity Left  Result Date: 06/09/2019 INDICATION: 85 year old male with left lower extremity critical limb ischemia ABI 06/05/2019 Right: 0.93 Left: 0.68 EXAM: ULTRASOUND GUIDED ACCESS RIGHT COMMON FEMORAL ARTERY LEFT LOWER EXTREMITY ANGIOGRAM TREATMENT OF TANDEM CRITICAL STENOSIS OF LEFT SFA WITH DIRECTIONAL ATHERECTOMY AND DRUG-ELUTING  BALLOON, AS WELL AS DRUG-ELUTING BALLOON ANGIO-SEAL FOR HEMOSTASIS MEDICATIONS: 7000 units IV heparin, 50 mg IV protamine sulfate ANESTHESIA/SEDATION: Moderate (conscious) sedation was employed during this procedure. A total of Versed 1.0 mg and Fentanyl 25 mcg was administered intravenously. Moderate Sedation Time: 60 minutes. The patient's level of consciousness and vital signs were monitored continuously by radiology nursing throughout the procedure under my direct supervision. CONTRAST:  70 cc visit base FLUOROSCOPY TIME:  Fluoroscopy Time: 9 minutes 18 seconds (40 mGy). COMPLICATIONS: None PROCEDURE: Informed consent was obtained from the patient following explanation of the procedure, risks, benefits and alternatives. The patient understands, agrees and consents for the procedure. All questions were addressed. A time out was performed prior to the initiation of the procedure. Maximal barrier sterile technique utilized including caps, mask, sterile gowns, sterile gloves, large sterile drape, hand hygiene, and Betadine prep. Ultrasound survey of the right inguinal region was performed with images stored and sent to PACs, confirming patency of the vessel. 1% lidocaine used for local anesthesia. Small stab incision was made with 11 blade scalpel. Blunt dissection was performed under ultrasound. A micropuncture needle was used access the right common femoral artery under ultrasound. With excellent arterial blood flow returned, and an .018 micro wire was passed through the needle, observed enter the abdominal aorta under fluoroscopy. The needle was removed, and a micropuncture sheath was placed over the wire. The inner dilator and wire were removed, and an 035 Bentson wire was advanced under fluoroscopy into the abdominal aorta. The sheath was removed and  a standard 5 [place] vascular sheath was placed. The dilator was removed and the sheath was flushed. Four French omni Flush sheath was advanced to the bifurcation.  Bentson wire and a Glidewire were used to navigate into the distal external iliac artery. Omni Flush sheath was advanced to the distal external iliac artery. Rose in wire was placed into the common femoral artery. Six [place] destination sheath was then placed over the bifurcation. Angiogram of the left lower extremity was performed. IV heparinization was performed with 7000 units. A Glidewire and vertebral catheter we were used to navigate through the proximal critical stenosis and into the distal SFA. A 7 mm spider wire protection device was deployed. Directional atherectomy was performed. Drug-eluting balloon angioplasty was then performed at the proximal SFA with a 6 mm x 60 mm impact balloon. Angiogram was performed. The protection device had migrated proximally. We used the retrieval device to reposition beyond the more distal critical stenosis in the adductor canal. With the protection device redeployed, standard balloon angioplasty was performed at the second treatment site, 5 mm by 40 mm. We then elected to treat this site with a 5 mm by 60 mm impact balloon. Angiogram was performed confirming excellent flow. The protection device was retrieved through the 035 balloon and the balloon was removed with the protection device. Final angiogram was performed of the left lower extremity. Angio-Seal was deployed at the right common femoral artery. Pulses confirmed at the left foot and the right foot. Patient tolerated procedure well and remained hemodynamically stable throughout. No complications were encountered and no significant blood loss. FINDINGS: Proximal SFA lesion greater than 90% stenosis pre treatment Proximal SFA lesion with no residual stenosis post treatment Distal SFA lesion with 80% stenosis pre treatment Distal SFA lesion with no residual stenosis post treatment IMPRESSION: Status post ultrasound guided access right common femoral artery for left lower extremity angiogram and treatment of tandem  high-grade stenosis of the superficial femoral artery with directional atherectomy/drug-eluting balloon, and PTA/drug-eluting balloon restoring excellent flow with no residual stenosis. Angio-Seal for hemostasis. Signed, Dulcy Fanny. Dellia Nims, RPVI [facility] Inter[facility] Rad[facility] Special[facility] Radiology Electronically Signed   By: Corrie Mckusick D.O.   On: 06/09/2019 10:49   Ir Angiogram Selective Each Additional Vessel  Result Date: 06/09/2019 INDICATION: 85 year old male with left lower extremity critical limb ischemia ABI 06/05/2019 Right: 0.93 Left: 0.68 EXAM: ULTRASOUND GUIDED ACCESS RIGHT COMMON FEMORAL ARTERY LEFT LOWER EXTREMITY ANGIOGRAM TREATMENT OF TANDEM CRITICAL STENOSIS OF LEFT SFA WITH DIRECTIONAL ATHERECTOMY AND DRUG-ELUTING BALLOON, AS WELL AS DRUG-ELUTING BALLOON ANGIO-SEAL FOR HEMOSTASIS MEDICATIONS: 7000 units IV heparin, 50 mg IV protamine sulfate ANESTHESIA/SEDATION: Moderate (conscious) sedation was employed during this procedure. A total of Versed 1.0 mg and Fentanyl 25 mcg was administered intravenously. Moderate Sedation Time: 60 minutes. The patient's level of consciousness and vital signs were monitored continuously by radiology nursing throughout the procedure under my direct supervision. CONTRAST:  70 cc visit base FLUOROSCOPY TIME:  Fluoroscopy Time: 9 minutes 18 seconds (40 mGy). COMPLICATIONS: None PROCEDURE: Informed consent was obtained from the patient following explanation of the procedure, risks, benefits and alternatives. The patient understands, agrees and consents for the procedure. All questions were addressed. A time out was performed prior to the initiation of the procedure. Maximal barrier sterile technique utilized including caps, mask, sterile gowns, sterile gloves, large sterile drape, hand hygiene, and Betadine prep. Ultrasound survey of the right inguinal region was performed with images stored and sent to PACs, confirming patency of  the vessel. 1%  lidocaine used for local anesthesia. Small stab incision was made with 11 blade scalpel. Blunt dissection was performed under ultrasound. A micropuncture needle was used access the right common femoral artery under ultrasound. With excellent arterial blood flow returned, and an .018 micro wire was passed through the needle, observed enter the abdominal aorta under fluoroscopy. The needle was removed, and a micropuncture sheath was placed over the wire. The inner dilator and wire were removed, and an 035 Bentson wire was advanced under fluoroscopy into the abdominal aorta. The sheath was removed and a standard 5 [place] vascular sheath was placed. The dilator was removed and the sheath was flushed. Four French omni Flush sheath was advanced to the bifurcation. Bentson wire and a Glidewire were used to navigate into the distal external iliac artery. Omni Flush sheath was advanced to the distal external iliac artery. Rose in wire was placed into the common femoral artery. Six [place] destination sheath was then placed over the bifurcation. Angiogram of the left lower extremity was performed. IV heparinization was performed with 7000 units. A Glidewire and vertebral catheter we were used to navigate through the proximal critical stenosis and into the distal SFA. A 7 mm spider wire protection device was deployed. Directional atherectomy was performed. Drug-eluting balloon angioplasty was then performed at the proximal SFA with a 6 mm x 60 mm impact balloon. Angiogram was performed. The protection device had migrated proximally. We used the retrieval device to reposition beyond the more distal critical stenosis in the adductor canal. With the protection device redeployed, standard balloon angioplasty was performed at the second treatment site, 5 mm by 40 mm. We then elected to treat this site with a 5 mm by 60 mm impact balloon. Angiogram was performed confirming excellent flow. The protection device was retrieved through  the 035 balloon and the balloon was removed with the protection device. Final angiogram was performed of the left lower extremity. Angio-Seal was deployed at the right common femoral artery. Pulses confirmed at the left foot and the right foot. Patient tolerated procedure well and remained hemodynamically stable throughout. No complications were encountered and no significant blood loss. FINDINGS: Proximal SFA lesion greater than 90% stenosis pre treatment Proximal SFA lesion with no residual stenosis post treatment Distal SFA lesion with 80% stenosis pre treatment Distal SFA lesion with no residual stenosis post treatment IMPRESSION: Status post ultrasound guided access right common femoral artery for left lower extremity angiogram and treatment of tandem high-grade stenosis of the superficial femoral artery with directional atherectomy/drug-eluting balloon, and PTA/drug-eluting balloon restoring excellent flow with no residual stenosis. Angio-Seal for hemostasis. Signed, Dulcy Fanny. Dellia Nims, RPVI Vascular and Interventional Radiology Specialists [facility] Radiology Electronically Signed   By: Corrie Mckusick D.O.   On: 06/09/2019 10:49   Ir Fem [place] Pta Mod Sed  Result Date: 06/09/2019 INDICATION: 85 year old male with left lower extremity critical limb ischemia ABI 06/05/2019 Right: 0.93 Left: 0.68 EXAM: ULTRASOUND GUIDED ACCESS RIGHT COMMON FEMORAL ARTERY LEFT LOWER EXTREMITY ANGIOGRAM TREATMENT OF TANDEM CRITICAL STENOSIS OF LEFT SFA WITH DIRECTIONAL ATHERECTOMY AND DRUG-ELUTING BALLOON, AS WELL AS DRUG-ELUTING BALLOON ANGIO-SEAL FOR HEMOSTASIS MEDICATIONS: 7000 units IV heparin, 50 mg IV protamine sulfate ANESTHESIA/SEDATION: Moderate (conscious) sedation was employed during this procedure. A total of Versed 1.0 mg and Fentanyl 25 mcg was administered intravenously. Moderate Sedation Time: 60 minutes. The patient's level of consciousness and vital signs were monitored continuously by  radiology nursing throughout the procedure under my direct  supervision. CONTRAST:  70 cc visit base FLUOROSCOPY TIME:  Fluoroscopy Time: 9 minutes 18 seconds (40 mGy). COMPLICATIONS: None PROCEDURE: Informed consent was obtained from the patient following explanation of the procedure, risks, benefits and alternatives. The patient understands, agrees and consents for the procedure. All questions were addressed. A time out was performed prior to the initiation of the procedure. Maximal barrier sterile technique utilized including caps, mask, sterile gowns, sterile gloves, large sterile drape, hand hygiene, and Betadine prep. Ultrasound survey of the right inguinal region was performed with images stored and sent to PACs, confirming patency of the vessel. 1% lidocaine used for local anesthesia. Small stab incision was made with 11 blade scalpel. Blunt dissection was performed under ultrasound. A micropuncture needle was used access the right common femoral artery under ultrasound. With excellent arterial blood flow returned, and an .018 micro wire was passed through the needle, observed enter the abdominal aorta under fluoroscopy. The needle was removed, and a micropuncture sheath was placed over the wire. The inner dilator and wire were removed, and an 035 Bentson wire was advanced under fluoroscopy into the abdominal aorta. The sheath was removed and a standard 5 [place] vascular sheath was placed. The dilator was removed and the sheath was flushed. Four French omni Flush sheath was advanced to the bifurcation. Bentson wire and a Glidewire were used to navigate into the distal external iliac artery. Omni Flush sheath was advanced to the distal external iliac artery. Rose in wire was placed into the common femoral artery. Six [place] destination sheath was then placed over the bifurcation. Angiogram of the left lower extremity was performed. IV heparinization was performed with 7000 units. A Glidewire and vertebral  catheter we were used to navigate through the proximal critical stenosis and into the distal SFA. A 7 mm spider wire protection device was deployed. Directional atherectomy was performed. Drug-eluting balloon angioplasty was then performed at the proximal SFA with a 6 mm x 60 mm impact balloon. Angiogram was performed. The protection device had migrated proximally. We used the retrieval device to reposition beyond the more distal critical stenosis in the adductor canal. With the protection device redeployed, standard balloon angioplasty was performed at the second treatment site, 5 mm by 40 mm. We then elected to treat this site with a 5 mm by 60 mm impact balloon. Angiogram was performed confirming excellent flow. The protection device was retrieved through the 035 balloon and the balloon was removed with the protection device. Final angiogram was performed of the left lower extremity. Angio-Seal was deployed at the right common femoral artery. Pulses confirmed at the left foot and the right foot. Patient tolerated procedure well and remained hemodynamically stable throughout. No complications were encountered and no significant blood loss. FINDINGS: Proximal SFA lesion greater than 90% stenosis pre treatment Proximal SFA lesion with no residual stenosis post treatment Distal SFA lesion with 80% stenosis pre treatment Distal SFA lesion with no residual stenosis post treatment IMPRESSION: Status post ultrasound guided access right common femoral artery for left lower extremity angiogram and treatment of tandem high-grade stenosis of the superficial femoral artery with directional atherectomy/drug-eluting balloon, and PTA/drug-eluting balloon restoring excellent flow with no residual stenosis. Angio-Seal for hemostasis. Signed, Dulcy Fanny. Dellia Nims, RPVI Vascular and Interventional Radiology Specialists [facility] Radiology Electronically Signed   By: Corrie Mckusick D.O.   On: 06/09/2019 10:49   Ir US Guide Vasc  Access Right  Result Date: 06/09/2019 INDICATION: 85 year old male with left lower extremity critical limb ischemia  ABI 06/05/2019 Right: 0.93 Left: 0.68 EXAM: ULTRASOUND GUIDED ACCESS RIGHT COMMON FEMORAL ARTERY LEFT LOWER EXTREMITY ANGIOGRAM TREATMENT OF TANDEM CRITICAL STENOSIS OF LEFT SFA WITH DIRECTIONAL ATHERECTOMY AND DRUG-ELUTING BALLOON, AS WELL AS DRUG-ELUTING BALLOON ANGIO-SEAL FOR HEMOSTASIS MEDICATIONS: 7000 units IV heparin, 50 mg IV protamine sulfate ANESTHESIA/SEDATION: Moderate (conscious) sedation was employed during this procedure. A total of Versed 1.0 mg and Fentanyl 25 mcg was administered intravenously. Moderate Sedation Time: 60 minutes. The patient's level of consciousness and vital signs were monitored continuously by radiology nursing throughout the procedure under my direct supervision. CONTRAST:  70 cc visit base FLUOROSCOPY TIME:  Fluoroscopy Time: 9 minutes 18 seconds (40 mGy). COMPLICATIONS: None PROCEDURE: Informed consent was obtained from the patient following explanation of the procedure, risks, benefits and alternatives. The patient understands, agrees and consents for the procedure. All questions were addressed. A time out was performed prior to the initiation of the procedure. Maximal barrier sterile technique utilized including caps, mask, sterile gowns, sterile gloves, large sterile drape, hand hygiene, and Betadine prep. Ultrasound survey of the right inguinal region was performed with images stored and sent to PACs, confirming patency of the vessel. 1% lidocaine used for local anesthesia. Small stab incision was made with 11 blade scalpel. Blunt dissection was performed under ultrasound. A micropuncture needle was used access the right common femoral artery under ultrasound. With excellent arterial blood flow returned, and an .018 micro wire was passed through the needle, observed enter the abdominal aorta under fluoroscopy. The needle was removed, and a micropuncture  sheath was placed over the wire. The inner dilator and wire were removed, and an 035 Bentson wire was advanced under fluoroscopy into the abdominal aorta. The sheath was removed and a standard 5 [place] vascular sheath was placed. The dilator was removed and the sheath was flushed. Four French omni Flush sheath was advanced to the bifurcation. Bentson wire and a Glidewire were used to navigate into the distal external iliac artery. Omni Flush sheath was advanced to the distal external iliac artery. Rose in wire was placed into the common femoral artery. Six [place] destination sheath was then placed over the bifurcation. Angiogram of the left lower extremity was performed. IV heparinization was performed with 7000 units. A Glidewire and vertebral catheter we were used to navigate through the proximal critical stenosis and into the distal SFA. A 7 mm spider wire protection device was deployed. Directional atherectomy was performed. Drug-eluting balloon angioplasty was then performed at the proximal SFA with a 6 mm x 60 mm impact balloon. Angiogram was performed. The protection device had migrated proximally. We used the retrieval device to reposition beyond the more distal critical stenosis in the adductor canal. With the protection device redeployed, standard balloon angioplasty was performed at the second treatment site, 5 mm by 40 mm. We then elected to treat this site with a 5 mm by 60 mm impact balloon. Angiogram was performed confirming excellent flow. The protection device was retrieved through the 035 balloon and the balloon was removed with the protection device. Final angiogram was performed of the left lower extremity. Angio-Seal was deployed at the right common femoral artery. Pulses confirmed at the left foot and the right foot. Patient tolerated procedure well and remained hemodynamically stable throughout. No complications were encountered and no significant blood loss. FINDINGS: Proximal SFA lesion  greater than 90% stenosis pre treatment Proximal SFA lesion with no residual stenosis post treatment Distal SFA lesion with 80% stenosis pre treatment Distal SFA lesion with  no residual stenosis post treatment IMPRESSION: Status post ultrasound guided access right common femoral artery for left lower extremity angiogram and treatment of tandem high-grade stenosis of the superficial femoral artery with directional atherectomy/drug-eluting balloon, and PTA/drug-eluting balloon restoring excellent flow with no residual stenosis. Angio-Seal for hemostasis. Signed, Dulcy Fanny. Dellia Nims, RPVI Vascular and Interventional Radiology Specialists [facility] Radiology Electronically Signed   By: Corrie Mckusick D.O.   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 Earleen Newport. Patient's condition stable- denies pain in lower extremities, wounds appear to be healing well without signs of active bleeding. Right groin incision stable. Continue taking Plavix 75 mg once daily and Aspirin 81 mg once daily. Plan to follow-up with Dr. Earleen Newport in clinic 4 weeks after discharge- order placed to facilitate this. Further plans per [facility]- appreciate and agree with management. Please call IR with questions/concerns.   Electronically Signed: Earley Abide, PA-C 06/12/2019, 9:11 AM   I spent a total of 25  Minutes at the the patient's bedside AND on the patient's hospital floor or unit, greater than 50% of which was counseling/coordinating care for left superficial femoral artery stenosis s/p revascularization.

## 2019-06-12 NOTE — Progress Notes (Signed)
Physical Therapy Treatment Patient Details Name: Antonio Estes. MRN: [id number] DOB: April 14, 1927 Today's Date: 06/12/2019    History of Present Illness Pt is a 85 y/o male admitted secondary to generalized weakness and falls at home. Pt was found to have L LE ischemia and incidental finding of aspiration pnuemonia. Plan is for tentative aorta peripheral angiogram and possible treatment of the L LE. PMH including but not limited to PVD and chronic bilateral LE wounds.    PT Comments    Pt admitted with above diagnosis.Pt was able to ambulate and incr distance with steady gait overall with RW.  Did fatigue at end of walk. Pt making progress but would still benefit from SNF prior to d/c home.   Pt currently with functional limitations due to the deficits listed below (see PT Problem List). Pt will benefit from skilled PT to increase their independence and safety with mobility to allow discharge to the venue listed below.     Follow Up Recommendations  SNF     Equipment Recommendations  None recommended by PT    Recommendations for Other Services       Precautions / Restrictions Precautions Precautions: Fall Restrictions Weight Bearing Restrictions: No    Mobility  Bed Mobility   Bed Mobility: Supine to Sit     Supine to sit: Min guard;HOB elevated     General bed mobility comments: Pt able to come to EOB and scoot forward to use urinal with min/guard  Transfers Overall transfer level: Needs assistance Equipment used: Rolling walker (2 wheeled) Transfers: Sit to/from Stand Sit to Stand: Min guard;From elevated surface;Min assist         General transfer comment: MIN A to power up, but no LOB today.  Pt used urinal in standing and stated he needed to have BM therefore obtained the 3N1 and pt had BM.  total assist to cllean pt.    Ambulation/Gait Ambulation/Gait assistance: Min guard Gait Distance (Feet): 150 Feet Assistive device: Rolling walker (2 wheeled) Gait  Pattern/deviations: Step-through pattern;Decreased stride length Gait velocity: decreased Gait velocity interpretation: <1.31 ft/sec, indicative of household ambulator General Gait Details: Pt ambulated well with incr distance.  He managed turns well, but fatigued at the end almost plopping into the chair at end of session.    Stairs             Wheelchair Mobility    Modified Rankin (Stroke Patients Only)       Balance Overall balance assessment: Needs assistance;History of Falls Sitting-balance support: Feet supported;No upper extremity supported Sitting balance-Leahy Scale: Fair   Postural control: Posterior lean;Other (comment)(upon standing) Standing balance support: Bilateral upper extremity supported Standing balance-Leahy Scale: Poor Standing balance comment: required UE support and external support at times with dynamic activity                            Cognition Arousal/Alertness: Awake/alert Behavior During Therapy: Flat affect Overall Cognitive Status: Within Functional Limits for tasks assessed                                 General Comments: flat affect      Exercises      General Comments        Pertinent Vitals/Pain Pain Assessment: No/denies pain    Home Living  Prior Function            PT Goals (current goals can now be found in the care plan section) Acute Rehab PT Goals Patient Stated Goal: to get stronger Progress towards PT goals: Progressing toward goals    Frequency    Min 2X/week      PT Plan Current plan remains appropriate    Co-evaluation              AM-PAC PT "6 Clicks" Mobility   Outcome Measure  Help needed turning from your back to your side while in a flat bed without using bedrails?: None Help needed moving from lying on your back to sitting on the side of a flat bed without using bedrails?: None Help needed moving to and from a bed to a  chair (including a wheelchair)?: A Little Help needed standing up from a chair using your arms (e.g., wheelchair or bedside chair)?: A Little Help needed to walk in hospital room?: A Little Help needed climbing 3-5 steps with a railing? : A Lot 6 Click Score: 19    End of Session Equipment Utilized During Treatment: Gait belt Activity Tolerance: Patient tolerated treatment well Patient left: in chair;with call bell/phone within reach;with chair alarm set Nurse Communication: Mobility status PT Visit Diagnosis: Other abnormalities of gait and mobility (R26.89)     Time: [id number] PT Time Calculation (min) (ACUTE ONLY): 23 min  Charges:  $Gait Training: 8-22 mins $Self Care/Home Management: 8-22                     Sneijder Bernards W,PT Acute Rehabilitation Services Pager:  [phone]  Office:  [phone]     Denice Paradise 06/12/2019, 11:45 AM

## 2019-06-13 DIAGNOSIS — D509 Iron deficiency anemia, unspecified: Secondary | ICD-10-CM | POA: Diagnosis not present

## 2019-06-13 DIAGNOSIS — I87311 Chronic venous hypertension (idiopathic) with ulcer of right lower extremity: Secondary | ICD-10-CM | POA: Diagnosis not present

## 2019-06-13 DIAGNOSIS — Z7401 Bed confinement status: Secondary | ICD-10-CM | POA: Diagnosis not present

## 2019-06-13 DIAGNOSIS — Z743 Need for continuous supervision: Secondary | ICD-10-CM | POA: Diagnosis not present

## 2019-06-13 DIAGNOSIS — Z791 Long term (current) use of non-steroidal anti-inflammatories (NSAID): Secondary | ICD-10-CM | POA: Diagnosis not present

## 2019-06-13 DIAGNOSIS — M6281 Muscle weakness (generalized): Secondary | ICD-10-CM | POA: Diagnosis not present

## 2019-06-13 DIAGNOSIS — Z79899 Other long term (current) drug therapy: Secondary | ICD-10-CM | POA: Diagnosis not present

## 2019-06-13 DIAGNOSIS — R600 Localized edema: Secondary | ICD-10-CM | POA: Diagnosis not present

## 2019-06-13 DIAGNOSIS — S064X0A Epidural hemorrhage without loss of consciousness, initial encounter: Secondary | ICD-10-CM | POA: Diagnosis not present

## 2019-06-13 DIAGNOSIS — I998 Other disorder of circulatory system: Secondary | ICD-10-CM | POA: Diagnosis not present

## 2019-06-13 DIAGNOSIS — R319 Hematuria, unspecified: Secondary | ICD-10-CM | POA: Diagnosis not present

## 2019-06-13 DIAGNOSIS — S0001XA Abrasion of scalp, initial encounter: Secondary | ICD-10-CM | POA: Diagnosis not present

## 2019-06-13 DIAGNOSIS — J690 Pneumonitis due to inhalation of food and vomit: Secondary | ICD-10-CM | POA: Diagnosis not present

## 2019-06-13 DIAGNOSIS — R5381 Other malaise: Secondary | ICD-10-CM | POA: Diagnosis not present

## 2019-06-13 DIAGNOSIS — I10 Essential (primary) hypertension: Secondary | ICD-10-CM | POA: Diagnosis not present

## 2019-06-13 DIAGNOSIS — R279 Unspecified lack of coordination: Secondary | ICD-10-CM | POA: Diagnosis not present

## 2019-06-13 DIAGNOSIS — Z7951 Long term (current) use of inhaled steroids: Secondary | ICD-10-CM | POA: Diagnosis not present

## 2019-06-13 DIAGNOSIS — L97812 Non-pressure chronic ulcer of other part of right lower leg with fat layer exposed: Secondary | ICD-10-CM | POA: Diagnosis not present

## 2019-06-13 DIAGNOSIS — R627 Adult failure to thrive: Secondary | ICD-10-CM | POA: Diagnosis not present

## 2019-06-13 DIAGNOSIS — S0990XA Unspecified injury of head, initial encounter: Secondary | ICD-10-CM | POA: Diagnosis not present

## 2019-06-13 DIAGNOSIS — I959 Hypotension, unspecified: Secondary | ICD-10-CM | POA: Diagnosis not present

## 2019-06-13 DIAGNOSIS — M2550 Pain in unspecified joint: Secondary | ICD-10-CM | POA: Diagnosis not present

## 2019-06-13 DIAGNOSIS — R2681 Unsteadiness on feet: Secondary | ICD-10-CM | POA: Diagnosis not present

## 2019-06-13 DIAGNOSIS — S0101XA Laceration without foreign body of scalp, initial encounter: Secondary | ICD-10-CM | POA: Diagnosis not present

## 2019-06-13 DIAGNOSIS — S0003XA Contusion of scalp, initial encounter: Secondary | ICD-10-CM | POA: Diagnosis not present

## 2019-06-13 DIAGNOSIS — S0191XA Laceration without foreign body of unspecified part of head, initial encounter: Secondary | ICD-10-CM | POA: Diagnosis not present

## 2019-06-13 DIAGNOSIS — W19XXXA Unspecified fall, initial encounter: Secondary | ICD-10-CM | POA: Diagnosis not present

## 2019-06-13 DIAGNOSIS — R269 Unspecified abnormalities of gait and mobility: Secondary | ICD-10-CM | POA: Diagnosis not present

## 2019-06-13 DIAGNOSIS — S199XXA Unspecified injury of neck, initial encounter: Secondary | ICD-10-CM | POA: Diagnosis not present

## 2019-06-13 DIAGNOSIS — I87312 Chronic venous hypertension (idiopathic) with ulcer of left lower extremity: Secondary | ICD-10-CM | POA: Diagnosis not present

## 2019-06-13 DIAGNOSIS — Z23 Encounter for immunization: Secondary | ICD-10-CM | POA: Diagnosis not present

## 2019-06-13 DIAGNOSIS — J449 Chronic obstructive pulmonary disease, unspecified: Secondary | ICD-10-CM | POA: Diagnosis not present

## 2019-06-13 DIAGNOSIS — M25572 Pain in left ankle and joints of left foot: Secondary | ICD-10-CM | POA: Diagnosis not present

## 2019-06-13 DIAGNOSIS — I739 Peripheral vascular disease, unspecified: Secondary | ICD-10-CM | POA: Diagnosis not present

## 2019-06-13 DIAGNOSIS — E039 Hypothyroidism, unspecified: Secondary | ICD-10-CM | POA: Diagnosis not present

## 2019-06-13 DIAGNOSIS — R5383 Other fatigue: Secondary | ICD-10-CM | POA: Diagnosis not present

## 2019-06-13 DIAGNOSIS — L0390 Cellulitis, unspecified: Secondary | ICD-10-CM | POA: Diagnosis not present

## 2019-06-13 DIAGNOSIS — R293 Abnormal posture: Secondary | ICD-10-CM | POA: Diagnosis not present

## 2019-06-13 DIAGNOSIS — L97919 Non-pressure chronic ulcer of unspecified part of right lower leg with unspecified severity: Secondary | ICD-10-CM | POA: Diagnosis not present

## 2019-06-13 DIAGNOSIS — M79671 Pain in right foot: Secondary | ICD-10-CM | POA: Diagnosis not present

## 2019-06-13 DIAGNOSIS — R262 Difficulty in walking, not elsewhere classified: Secondary | ICD-10-CM | POA: Diagnosis not present

## 2019-06-13 DIAGNOSIS — Z9181 History of falling: Secondary | ICD-10-CM | POA: Diagnosis not present

## 2019-06-13 DIAGNOSIS — R531 Weakness: Secondary | ICD-10-CM | POA: Diagnosis not present

## 2019-06-13 DIAGNOSIS — K5900 Constipation, unspecified: Secondary | ICD-10-CM | POA: Diagnosis not present

## 2019-06-13 DIAGNOSIS — E875 Hyperkalemia: Secondary | ICD-10-CM | POA: Diagnosis not present

## 2019-06-13 DIAGNOSIS — I499 Cardiac arrhythmia, unspecified: Secondary | ICD-10-CM | POA: Diagnosis not present

## 2019-06-13 DIAGNOSIS — R278 Other lack of coordination: Secondary | ICD-10-CM | POA: Diagnosis not present

## 2019-06-13 DIAGNOSIS — Z741 Need for assistance with personal care: Secondary | ICD-10-CM | POA: Diagnosis not present

## 2019-06-13 DIAGNOSIS — K4090 Unilateral inguinal hernia, without obstruction or gangrene, not specified as recurrent: Secondary | ICD-10-CM | POA: Diagnosis not present

## 2019-06-13 DIAGNOSIS — E43 Unspecified severe protein-calorie malnutrition: Secondary | ICD-10-CM | POA: Diagnosis not present

## 2019-06-13 MED ORDER — ADULT MULTIVITAMIN W/MINERALS CH: 1.0000 | ORAL_TABLET | Freq: Every day | ORAL | 0 refills | Status: AC

## 2019-06-13 MED ORDER — DOXYCYCLINE HYCLATE 100 MG PO TABS: 100.0000 mg | ORAL_TABLET | Freq: Two times a day (BID) | ORAL | 0 refills | Status: AC

## 2019-06-13 MED ORDER — POLYETHYLENE GLYCOL 3350 17 G PO PACK: 17.0000 g | PACK | Freq: Every day | ORAL | 0 refills | Status: AC

## 2019-06-13 MED ORDER — ENSURE ENLIVE PO LIQD: 237.0000 mL | Freq: Two times a day (BID) | ORAL | 12 refills | Status: AC

## 2019-06-13 MED ORDER — CEPHALEXIN 500 MG PO CAPS: 500.0000 mg | ORAL_CAPSULE | Freq: Three times a day (TID) | ORAL | 0 refills | Status: AC

## 2019-06-13 MED ORDER — TRAMADOL HCL 50 MG PO TABS: 50.0000 mg | ORAL_TABLET | Freq: Four times a day (QID) | ORAL | 0 refills | Status: AC | PRN

## 2019-06-13 MED ORDER — CLOPIDOGREL BISULFATE 75 MG PO TABS: 75.0000 mg | ORAL_TABLET | Freq: Every day | ORAL | 3 refills | Status: AC

## 2019-06-13 MED ORDER — COLLAGENASE 250 UNIT/GM EX OINT: TOPICAL_OINTMENT | Freq: Every day | CUTANEOUS | 0 refills | Status: AC

## 2019-06-13 MED ORDER — FERROUS SULFATE 325 (65 FE) MG PO TABS: 325.0000 mg | ORAL_TABLET | Freq: Every day | ORAL | 3 refills | Status: AC

## 2019-06-13 MED ORDER — ASPIRIN 81 MG PO TBEC: 81.0000 mg | DELAYED_RELEASE_TABLET | Freq: Every day | ORAL | 3 refills | Status: AC

## 2019-06-13 NOTE — TOC Transition Note (Addendum)
Transition of Care [facility]) - CM/SW Discharge Note   Patient Details  Name: Antonio Estes. MRN: [id number] Date of Birth: Oct 01, 1926  Transition of [facility]) CM/SW Contact:  Alexander Mt, [place] Phone Number: 06/13/2019, 10:38 AM   Clinical Narrative:    Pt authorization received, pt stable per MD. Dc summary sent to [facility] [place] received and COVID neg. Pt aware of timing of dc and requested that CSW call [name] "my boss" at [phone] or [phone]. Able to reach [name] and update him with discharge plan at [phone]; the other number is out of service. Facesheet updated.  [employer] Josem Kaufmann [phone]   Final next level of care: Skilled Nursing Facility Barriers to Discharge: Barriers Resolved   Patient Goals and CMS Choice Patient states their goals for this hospitalization and ongoing recovery are:: Rehab to get stronger CMS Medicare.gov Compare Post Acute Care list provided to:: Patient Choice offered to / list presented to : Patient  Discharge Placement PASRR number recieved: 06/10/19            Patient chooses bed at: [facility] Patient to be transferred to facility by: [place] Name of family member notified: pt declines family but requested CSW call his boss [name] at [phone]  Patient and family notified of of transfer: 06/13/19  Discharge Plan and Services In-house Referral: Clinical Social Work Discharge Planning Services: NA Post Acute Care Choice: [place]          DME Arranged: N/A  HH Arranged: NA  Readmission Risk Interventions Readmission Risk Prevention Plan 06/12/2019  Post Dischage Appt Not Complete  Appt Comments plan for SNF  Medication Screening Complete  Transportation Screening Complete  Some recent data might be hidden

## 2019-06-13 NOTE — Discharge Summary (Addendum)
Physician Discharge Summary  Antonio Estes. UV:[id number] DOB: 30-Mar-1927 DOA: 06/07/2019  PCP: Imagene Riches, NP  Admit date: 06/07/2019 Discharge date: 06/13/2019  Admitted From: Home  Disposition:  SNF  Recommendations for Outpatient Follow-up:  1. Follow up with PCP in 1-2 weeks 2. Please obtain BMP/CBC in one week 3. Needs to follow up with Dr Earleen Newport in 4 weeks.  4. Continue with wound care.     Discharge Condition: Stable.  CODE STATUS: Full code Diet recommendation: Regular  Brief/Interim Summary: 85 year old with past medical history significant for peripheral vascular disease, chronic bilateral lower extremity wounds followed by home health and outpatient wound care center who presented to [facility] on 11/13 following a fall 2 days prior to admission due to generalized weakness.  Patient denies any loss of consciousness alert head injury. He was subsequently found to have left lower extremity ischemia.  ABIs show resting ABI of the left extremity in the moderate range arterial occlusive disease and the femoral-popliteal segment and bilateral arteries.  CTA aortic with femoral runoff shows several areas of progressive disease.  Patient was evaluated by Dr. Earleen Newport with IR at [facility] who recommended patient to be transferred to [facility] for tentative aorto peripheral angiogram and possible treatment of left lower extremity. Anticoagulation with heparin was started. He was started on augmenti, for his legs wounds and also coverage of incidental finding of aspiration PNA.   S/P LLE angiogram, treatment of tandem high grade 90 % stenosis of the SFA with atherectomy/DEB, and PTA /DEB with excellent angiographic outcome.   1-Acute ischemia superimposed on peripheral vascular disease with chronic bilateral lower extremity wounds: superimpose cellulitis.  -Patient had  significant redness of lower extremity, malodorous. He was treated with IV vancomycin and  ceftriaxone and Flagyl. Discussed with Dr Earleen Newport, patient doesn't need Heparin Gtt post procedure. He was loaded with plavix. Plan to continue with aspirin 81 mg daily and plavix 75 mg daily.  IV morphine and tramadol PRN.  S/P LLE angiogram, treatment of tandem high grade 90 % stenosis of the SFA with atherectomy/DEB, and PTA /DEB with excellent angiographic outcome.  Change to oral keflex and doxy for 7 days.  PT recommend SNF.  He was bleeding from one of the ulcer LE> resolved Awaiting rehab,. Continue with wound care.   Failure to thrive: PT nutritionist consulted.  I have order Ensure.  Severe protein caloric malnutrition: Added Ensure. Prealbumin 13.2.  aspiration pneumonia; treated with  antibiotics.  Speech therapy consult  Hyperkalemia outside facility labs. resolved.  Normocytic anemia: Iron deficiency anemia: Continue with iron supplement. Follow hb.  Hypothyroidism: Continue with Synthroid.  Left indirect inguinal hernia; -Incidental finding on the CTA.  No abdominal complaints.  Monitor  Discharge Diagnoses:  Active Problems:   Critical lower limb ischemia   Normocytic anemia   PVD (peripheral vascular disease) (HCC)   Failure to thrive in adult   Protein-calorie malnutrition, severe    Discharge Instructions  Discharge Instructions    Diet general   Complete by: As directed    Increase activity slowly   Complete by: As directed      Allergies as of 06/13/2019   No Known Allergies     Medication List    TAKE these medications   aspirin 81 MG EC tablet Take 1 tablet (81 mg total) by mouth daily. Start taking on: June 14, 2019   cephALEXin 500 MG capsule Commonly known as: KEFLEX Take 1 capsule (500 mg total) by mouth  every 8 (eight) hours for 4 days.   clopidogrel 75 MG tablet Commonly known as: PLAVIX Take 1 tablet (75 mg total) by mouth daily. Start taking on: June 14, 2019   collagenase ointment Commonly known as:  SANTYL Apply topically daily.   doxycycline 100 MG tablet Commonly known as: VIBRA-TABS Take 1 tablet (100 mg total) by mouth every 12 (twelve) hours for 4 days.   Euthyrox 50 MCG tablet Generic drug: levothyroxine Take 50 mcg by mouth daily before breakfast.   feeding supplement (ENSURE ENLIVE) Liqd Take 237 mLs by mouth 2 (two) times daily between meals.   ferrous sulfate 325 (65 FE) MG tablet Take 1 tablet (325 mg total) by mouth daily with breakfast. Start taking on: June 14, 2019   fluticasone 50 MCG/ACT nasal spray Commonly known as: FLONASE Place 1 spray into both nostrils daily.   multivitamin with minerals Tabs tablet Take 1 tablet by mouth daily. Start taking on: June 14, 2019   polyethylene glycol 17 g packet Commonly known as: MIRALAX / GLYCOLAX Take 17 g by mouth daily. Start taking on: June 14, 2019   traMADol 50 MG tablet Commonly known as: ULTRAM Take 1 tablet (50 mg total) by mouth every 6 (six) hours as needed for up to 5 days for moderate pain.       Contact information for follow-up providers    Corrie Mckusick, DO Follow up in 4 week(s).   Specialties: Interventional Radiology, Radiology Why: Please follow-up with Dr. Earleen Newport in clinic 4 weeks after discharge. Our office will call you to set up this appointment. Contact information: [address] [place] [address] [phone]            Contact information for after-discharge care    Destination    [facility]erred SNF .   Service: Skilled Nursing Contact information: [address] [place] [address] [phone]                 No Known Allergies  Consultations:  IR   Procedures/Studies: Ir Angiogram Extremity Left  Result Date: 06/09/2019 INDICATION: 85 year old male with left lower extremity critical limb ischemia ABI 06/05/2019 Right: 0.93 Left: 0.68 EXAM: ULTRASOUND GUIDED ACCESS RIGHT COMMON FEMORAL  ARTERY LEFT LOWER EXTREMITY ANGIOGRAM TREATMENT OF TANDEM CRITICAL STENOSIS OF LEFT SFA WITH DIRECTIONAL ATHERECTOMY AND DRUG-ELUTING BALLOON, AS WELL AS DRUG-ELUTING BALLOON ANGIO-SEAL FOR HEMOSTASIS MEDICATIONS: 7000 units IV heparin, 50 mg IV protamine sulfate ANESTHESIA/SEDATION: Moderate (conscious) sedation was employed during this procedure. A total of Versed 1.0 mg and Fentanyl 25 mcg was administered intravenously. Moderate Sedation Time: 60 minutes. The patient's level of consciousness and vital signs were monitored continuously by radiology nursing throughout the procedure under my direct supervision. CONTRAST:  70 cc visit base FLUOROSCOPY TIME:  Fluoroscopy Time: 9 minutes 18 seconds (40 mGy). COMPLICATIONS: None PROCEDURE: Informed consent was obtained from the patient following explanation of the procedure, risks, benefits and alternatives. The patient understands, agrees and consents for the procedure. All questions were addressed. A time out was performed prior to the initiation of the procedure. Maximal barrier sterile technique utilized including caps, mask, sterile gowns, sterile gloves, large sterile drape, hand hygiene, and Betadine prep. Ultrasound survey of the right inguinal region was performed with images stored and sent to PACs, confirming patency of the vessel. 1% lidocaine used for local anesthesia. Small stab incision was made with 11 blade scalpel. Blunt dissection was performed under ultrasound. A micropuncture needle  was used access the right common femoral artery under ultrasound. With excellent arterial blood flow returned, and an .018 micro wire was passed through the needle, observed enter the abdominal aorta under fluoroscopy. The needle was removed, and a micropuncture sheath was placed over the wire. The inner dilator and wire were removed, and an 035 Bentson wire was advanced under fluoroscopy into the abdominal aorta. The sheath was removed and a standard 5 [place] vascular  sheath was placed. The dilator was removed and the sheath was flushed. Four French omni Flush sheath was advanced to the bifurcation. Bentson wire and a Glidewire were used to navigate into the distal external iliac artery. Omni Flush sheath was advanced to the distal external iliac artery. Rose in wire was placed into the common femoral artery. Six [place] destination sheath was then placed over the bifurcation. Angiogram of the left lower extremity was performed. IV heparinization was performed with 7000 units. A Glidewire and vertebral catheter we were used to navigate through the proximal critical stenosis and into the distal SFA. A 7 mm spider wire protection device was deployed. Directional atherectomy was performed. Drug-eluting balloon angioplasty was then performed at the proximal SFA with a 6 mm x 60 mm impact balloon. Angiogram was performed. The protection device had migrated proximally. We used the retrieval device to reposition beyond the more distal critical stenosis in the adductor canal. With the protection device redeployed, standard balloon angioplasty was performed at the second treatment site, 5 mm by 40 mm. We then elected to treat this site with a 5 mm by 60 mm impact balloon. Angiogram was performed confirming excellent flow. The protection device was retrieved through the 035 balloon and the balloon was removed with the protection device. Final angiogram was performed of the left lower extremity. Angio-Seal was deployed at the right common femoral artery. Pulses confirmed at the left foot and the right foot. Patient tolerated procedure well and remained hemodynamically stable throughout. No complications were encountered and no significant blood loss. FINDINGS: Proximal SFA lesion greater than 90% stenosis pre treatment Proximal SFA lesion with no residual stenosis post treatment Distal SFA lesion with 80% stenosis pre treatment Distal SFA lesion with no residual stenosis post treatment  IMPRESSION: Status post ultrasound guided access right common femoral artery for left lower extremity angiogram and treatment of tandem high-grade stenosis of the superficial femoral artery with directional atherectomy/drug-eluting balloon, and PTA/drug-eluting balloon restoring excellent flow with no residual stenosis. Angio-Seal for hemostasis. Signed, Dulcy Fanny. Dellia Nims, RPVI [facility] Interventional Radiology Specialists [facility] Radiology Electronically Signed   By: Corrie Mckusick D.O.   On: 06/09/2019 10:49   Ir Angiogram Selective Each Additional Vessel  Result Date: 06/09/2019 INDICATION: 85 year old male with left lower extremity critical limb ischemia ABI 06/05/2019 Right: 0.93 Left: 0.68 EXAM: ULTRASOUND GUIDED ACCESS RIGHT COMMON FEMORAL ARTERY LEFT LOWER EXTREMITY ANGIOGRAM TREATMENT OF TANDEM CRITICAL STENOSIS OF LEFT SFA WITH DIRECTIONAL ATHERECTOMY AND DRUG-ELUTING BALLOON, AS WELL AS DRUG-ELUTING BALLOON ANGIO-SEAL FOR HEMOSTASIS MEDICATIONS: 7000 units IV heparin, 50 mg IV protamine sulfate ANESTHESIA/SEDATION: Moderate (conscious) sedation was employed during this procedure. A total of Versed 1.0 mg and Fentanyl 25 mcg was administered intravenously. Moderate Sedation Time: 60 minutes. The patient's level of consciousness and vital signs were monitored continuously by radiology nursing throughout the procedure under my direct supervision. CONTRAST:  70 cc visit base FLUOROSCOPY TIME:  Fluoroscopy Time: 9 minutes 18 seconds (40 mGy). COMPLICATIONS: None PROCEDURE: Informed consent was obtained from the patient following explanation  of the procedure, risks, benefits and alternatives. The patient understands, agrees and consents for the procedure. All questions were addressed. A time out was performed prior to the initiation of the procedure. Maximal barrier sterile technique utilized including caps, mask, sterile gowns, sterile gloves, large sterile drape, hand hygiene, and Betadine prep.  Ultrasound survey of the right inguinal region was performed with images stored and sent to PACs, confirming patency of the vessel. 1% lidocaine used for local anesthesia. Small stab incision was made with 11 blade scalpel. Blunt dissection was performed under ultrasound. A micropuncture needle was used access the right common femoral artery under ultrasound. With excellent arterial blood flow returned, and an .018 micro wire was passed through the needle, observed enter the abdominal aorta under fluoroscopy. The needle was removed, and a micropuncture sheath was placed over the wire. The inner dilator and wire were removed, and an 035 Bentson wire was advanced under fluoroscopy into the abdominal aorta. The sheath was removed and a standard 5 [place] vascular sheath was placed. The dilator was removed and the sheath was flushed. Four French omni Flush sheath was advanced to the bifurcation. Bentson wire and a Glidewire were used to navigate into the distal external iliac artery. Omni Flush sheath was advanced to the distal external iliac artery. Rose in wire was placed into the common femoral artery. Six [place] destination sheath was then placed over the bifurcation. Angiogram of the left lower extremity was performed. IV heparinization was performed with 7000 units. A Glidewire and vertebral catheter we were used to navigate through the proximal critical stenosis and into the distal SFA. A 7 mm spider wire protection device was deployed. Directional atherectomy was performed. Drug-eluting balloon angioplasty was then performed at the proximal SFA with a 6 mm x 60 mm impact balloon. Angiogram was performed. The protection device had migrated proximally. We used the retrieval device to reposition beyond the more distal critical stenosis in the adductor canal. With the protection device redeployed, standard balloon angioplasty was performed at the second treatment site, 5 mm by 40 mm. We then elected to treat this  site with a 5 mm by 60 mm impact balloon. Angiogram was performed confirming excellent flow. The protection device was retrieved through the 035 balloon and the balloon was removed with the protection device. Final angiogram was performed of the left lower extremity. Angio-Seal was deployed at the right common femoral artery. Pulses confirmed at the left foot and the right foot. Patient tolerated procedure well and remained hemodynamically stable throughout. No complications were encountered and no significant blood loss. FINDINGS: Proximal SFA lesion greater than 90% stenosis pre treatment Proximal SFA lesion with no residual stenosis post treatment Distal SFA lesion with 80% stenosis pre treatment Distal SFA lesion with no residual stenosis post treatment IMPRESSION: Status post ultrasound guided access right common femoral artery for left lower extremity angiogram and treatment of tandem high-grade stenosis of the superficial femoral artery with directional atherectomy/drug-eluting balloon, and PTA/drug-eluting balloon restoring excellent flow with no residual stenosis. Angio-Seal for hemostasis. Signed, Dulcy Fanny. Dellia Nims, RPVI Vascular and Interventional Radiology Specialists [facility] Radiology Electronically Signed   By: Corrie Mckusick D.O.   On: 06/09/2019 10:49   Ir Fem Pop [employer] Pta Mod Sed  Result Date: 06/09/2019 INDICATION: 85 year old male with left lower extremity critical limb ischemia ABI 06/05/2019 Right: 0.93 Left: 0.68 EXAM: ULTRASOUND GUIDED ACCESS RIGHT COMMON FEMORAL ARTERY LEFT LOWER EXTREMITY ANGIOGRAM TREATMENT OF TANDEM CRITICAL STENOSIS OF LEFT SFA WITH  DIRECTIONAL ATHERECTOMY AND DRUG-ELUTING BALLOON, AS WELL AS DRUG-ELUTING BALLOON ANGIO-SEAL FOR HEMOSTASIS MEDICATIONS: 7000 units IV heparin, 50 mg IV protamine sulfate ANESTHESIA/SEDATION: Moderate (conscious) sedation was employed during this procedure. A total of Versed 1.0 mg and Fentanyl 25 mcg was administered  intravenously. Moderate Sedation Time: 60 minutes. The patient's level of consciousness and vital signs were monitored continuously by radiology nursing throughout the procedure under my direct supervision. CONTRAST:  70 cc visit base FLUOROSCOPY TIME:  Fluoroscopy Time: 9 minutes 18 seconds (40 mGy). COMPLICATIONS: None PROCEDURE: Informed consent was obtained from the patient following explanation of the procedure, risks, benefits and alternatives. The patient understands, agrees and consents for the procedure. All questions were addressed. A time out was performed prior to the initiation of the procedure. Maximal barrier sterile technique utilized including caps, mask, sterile gowns, sterile gloves, large sterile drape, hand hygiene, and Betadine prep. Ultrasound survey of the right inguinal region was performed with images stored and sent to PACs, confirming patency of the vessel. 1% lidocaine used for local anesthesia. Small stab incision was made with 11 blade scalpel. Blunt dissection was performed under ultrasound. A micropuncture needle was used access the right common femoral artery under ultrasound. With excellent arterial blood flow returned, and an .018 micro wire was passed through the needle, observed enter the abdominal aorta under fluoroscopy. The needle was removed, and a micropuncture sheath was placed over the wire. The inner dilator and wire were removed, and an 035 Bentson wire was advanced under fluoroscopy into the abdominal aorta. The sheath was removed and a standard 5 [place] vascular sheath was placed. The dilator was removed and the sheath was flushed. Four French omni Flush sheath was advanced to the bifurcation. Bentson wire and a Glidewire were used to navigate into the distal external iliac artery. Omni Flush sheath was advanced to the distal external iliac artery. Rose in wire was placed into the common femoral artery. Six [place] destination sheath was then placed over the  bifurcation. Angiogram of the left lower extremity was performed. IV heparinization was performed with 7000 units. A Glidewire and vertebral catheter we were used to navigate through the proximal critical stenosis and into the distal SFA. A 7 mm spider wire protection device was deployed. Directional atherectomy was performed. Drug-eluting balloon angioplasty was then performed at the proximal SFA with a 6 mm x 60 mm impact balloon. Angiogram was performed. The protection device had migrated proximally. We used the retrieval device to reposition beyond the more distal critical stenosis in the adductor canal. With the protection device redeployed, standard balloon angioplasty was performed at the second treatment site, 5 mm by 40 mm. We then elected to treat this site with a 5 mm by 60 mm impact balloon. Angiogram was performed confirming excellent flow. The protection device was retrieved through the 035 balloon and the balloon was removed with the protection device. Final angiogram was performed of the left lower extremity. Angio-Seal was deployed at the right common femoral artery. Pulses confirmed at the left foot and the right foot. Patient tolerated procedure well and remained hemodynamically stable throughout. No complications were encountered and no significant blood loss. FINDINGS: Proximal SFA lesion greater than 90% stenosis pre treatment Proximal SFA lesion with no residual stenosis post treatment Distal SFA lesion with 80% stenosis pre treatment Distal SFA lesion with no residual stenosis post treatment IMPRESSION: Status post ultrasound guided access right common femoral artery for left lower extremity angiogram and treatment of tandem high-grade stenosis of  the superficial femoral artery with directional atherectomy/drug-eluting balloon, and PTA/drug-eluting balloon restoring excellent flow with no residual stenosis. Angio-Seal for hemostasis. Signed, Dulcy Fanny. Dellia Nims, RPVI Vascular and  Interventional Radiology Specialists [facility] Radiology Electronically Signed   By: Corrie Mckusick D.O.   On: 06/09/2019 10:49   Ir US Guide Vasc Access Right  Result Date: 06/09/2019 INDICATION: 85 year old male with left lower extremity critical limb ischemia ABI 06/05/2019 Right: 0.93 Left: 0.68 EXAM: ULTRASOUND GUIDED ACCESS RIGHT COMMON FEMORAL ARTERY LEFT LOWER EXTREMITY ANGIOGRAM TREATMENT OF TANDEM CRITICAL STENOSIS OF LEFT SFA WITH DIRECTIONAL ATHERECTOMY AND DRUG-ELUTING BALLOON, AS WELL AS DRUG-ELUTING BALLOON ANGIO-SEAL FOR HEMOSTASIS MEDICATIONS: 7000 units IV heparin, 50 mg IV protamine sulfate ANESTHESIA/SEDATION: Moderate (conscious) sedation was employed during this procedure. A total of Versed 1.0 mg and Fentanyl 25 mcg was administered intravenously. Moderate Sedation Time: 60 minutes. The patient's level of consciousness and vital signs were monitored continuously by radiology nursing throughout the procedure under my direct supervision. CONTRAST:  70 cc visit base FLUOROSCOPY TIME:  Fluoroscopy Time: 9 minutes 18 seconds (40 mGy). COMPLICATIONS: None PROCEDURE: Informed consent was obtained from the patient following explanation of the procedure, risks, benefits and alternatives. The patient understands, agrees and consents for the procedure. All questions were addressed. A time out was performed prior to the initiation of the procedure. Maximal barrier sterile technique utilized including caps, mask, sterile gowns, sterile gloves, large sterile drape, hand hygiene, and Betadine prep. Ultrasound survey of the right inguinal region was performed with images stored and sent to PACs, confirming patency of the vessel. 1% lidocaine used for local anesthesia. Small stab incision was made with 11 blade scalpel. Blunt dissection was performed under ultrasound. A micropuncture needle was used access the right common femoral artery under ultrasound. With excellent arterial blood flow returned, and  an .018 micro wire was passed through the needle, observed enter the abdominal aorta under fluoroscopy. The needle was removed, and a micropuncture sheath was placed over the wire. The inner dilator and wire were removed, and an 035 Bentson wire was advanced under fluoroscopy into the abdominal aorta. The sheath was removed and a standard 5 [place] vascular sheath was placed. The dilator was removed and the sheath was flushed. Four French omni Flush sheath was advanced to the bifurcation. Bentson wire and a Glidewire were used to navigate into the distal external iliac artery. Omni Flush sheath was advanced to the distal external iliac artery. Rose in wire was placed into the common femoral artery. Six [place] destination sheath was then placed over the bifurcation. Angiogram of the left lower extremity was performed. IV heparinization was performed with 7000 units. A Glidewire and vertebral catheter we were used to navigate through the proximal critical stenosis and into the distal SFA. A 7 mm spider wire protection device was deployed. Directional atherectomy was performed. Drug-eluting balloon angioplasty was then performed at the proximal SFA with a 6 mm x 60 mm impact balloon. Angiogram was performed. The protection device had migrated proximally. We used the retrieval device to reposition beyond the more distal critical stenosis in the adductor canal. With the protection device redeployed, standard balloon angioplasty was performed at the second treatment site, 5 mm by 40 mm. We then elected to treat this site with a 5 mm by 60 mm impact balloon. Angiogram was performed confirming excellent flow. The protection device was retrieved through the 035 balloon and the balloon was removed with the protection device. Final angiogram was performed of the left  lower extremity. Angio-Seal was deployed at the right common femoral artery. Pulses confirmed at the left foot and the right foot. Patient tolerated procedure well  and remained hemodynamically stable throughout. No complications were encountered and no significant blood loss. FINDINGS: Proximal SFA lesion greater than 90% stenosis pre treatment Proximal SFA lesion with no residual stenosis post treatment Distal SFA lesion with 80% stenosis pre treatment Distal SFA lesion with no residual stenosis post treatment IMPRESSION: Status post ultrasound guided access right common femoral artery for left lower extremity angiogram and treatment of tandem high-grade stenosis of the superficial femoral artery with directional atherectomy/drug-eluting balloon, and PTA/drug-eluting balloon restoring excellent flow with no residual stenosis. Angio-Seal for hemostasis. Signed, Dulcy Fanny. Dellia Nims, RPVI [facility] Radiology Electronically Signed   By: Corrie Mckusick D.O.   On: 06/09/2019 10:49     Subjective: He report pain is controlled. Had BM>   Discharge Exam: Vitals:   06/12/19 2222 06/13/19 0530  BP: 120/64 113/60  Pulse: 74 63  Resp: 15 15  Temp: 98 F (36.7 C) (!) 97.5 F (36.4 C)  SpO2: 97% 96%     General: Pt is alert, awake, not in acute distress Cardiovascular: RRR, S1/S2 +, no rubs, no gallops Respiratory: CTA bilaterally, no wheezing, no rhonchi Abdominal: Soft, NT, ND, bowel sounds + Extremities: multiples ulcer, less redness. B/L dressing     The results of significant diagnostics from this hospitalization (including imaging, microbiology, ancillary and laboratory) are listed below for reference.     Microbiology: Recent Results (from the past 240 hour(s))  SARS CORONAVIRUS 2 (TAT 6-24 HRS) Nasopharyngeal Nasopharyngeal Swab     Status: None   Collection Time: 06/10/19  4:55 PM   Specimen: Nasopharyngeal Swab  Result Value Ref Range Status   SARS Coronavirus 2 NEGATIVE NEGATIVE Final    Comment: (NOTE) SARS-CoV-2 target nucleic acids are NOT DETECTED. The SARS-CoV-2 RNA is generally  detectable in upper and lower respiratory specimens during the acute phase of infection. Negative results do not preclude SARS-CoV-2 infection, do not rule out co-infections with other pathogens, and should not be used as the sole basis for treatment or other patient management decisions. Negative results must be combined with clinical observations, patient history, and epidemiological information. The expected result is Negative. Fact Sheet for Patients: SugarRoll.be Fact Sheet for Healthcare Providers: https://www.woods-mathews.com/ This test is not yet approved or cleared by the [place] FDA and  has been authorized for detection and/or diagnosis of SARS-CoV-2 by FDA under an Emergency Use Authorization (EUA). This EUA will remain  in effect (meaning this test can be used) for the duration of the COVID-19 declaration under Section 56 4(b)(1) of the Act, 21 U.S.C. section 360bbb-3(b)(1), unless the authorization is terminated or revoked sooner. Performed at [facility], [place] [address] [place], [place] [address]   SARS CORONAVIRUS 2 (TAT 6-24 HRS) Nasopharyngeal Nasopharyngeal Swab     Status: None   Collection Time: 06/12/19  1:44 PM   Specimen: Nasopharyngeal Swab  Result Value Ref Range Status   SARS Coronavirus 2 NEGATIVE NEGATIVE Final    Comment: (NOTE) SARS-CoV-2 target nucleic acids are NOT DETECTED. The SARS-CoV-2 RNA is generally detectable in upper and lower respiratory specimens during the acute phase of infection. Negative results do not preclude SARS-CoV-2 infection, do not rule out co-infections with other pathogens, and should not be used as the sole basis for treatment or other patient management decisions. Negative results must be  combined with clinical observations, patient history, and epidemiological information. The expected result is Negative. Fact Sheet for  Patients: SugarRoll.be Fact Sheet for Healthcare Providers: https://www.woods-mathews.com/ This test is not yet approved or cleared by the [place] FDA and  has been authorized for detection and/or diagnosis of SARS-CoV-2 by FDA under an Emergency Use Authorization (EUA). This EUA will remain  in effect (meaning this test can be used) for the duration of the COVID-19 declaration under Section 56 4(b)(1) of the Act, 21 U.S.C. section 360bbb-3(b)(1), unless the authorization is terminated or revoked sooner. Performed at [facility], [place] [address] [place], [place] [address]      Labs: BNP (last 3 results) No results for input(s): BNP in the last 8760 hours. Basic Metabolic Panel: Recent Labs  Lab 06/07/19 2032 06/08/19 0629 06/10/19 0318 06/12/19 1608  NA 135 137 137 135  K 4.1 3.8 3.5 4.0  CL 105 102 106 103  CO2 23 24 23 25   GLUCOSE 104* 88 96 115*  BUN 11 9 9 12   CREATININE 0.68 0.77 0.77 0.91  CALCIUM 8.0* 8.1* 8.0* 8.3*   Liver Function Tests: No results for input(s): AST, ALT, ALKPHOS, BILITOT, PROT, ALBUMIN in the last 168 hours. No results for input(s): LIPASE, AMYLASE in the last 168 hours. No results for input(s): AMMONIA in the last 168 hours. CBC: Recent Labs  Lab 06/07/19 2032 06/08/19 0629 06/09/19 0727 06/10/19 0318 06/12/19 1608  WBC 4.7 4.7 5.2 6.0 7.9  HGB 9.6* 9.1* 9.3* 9.3* 10.3*  HCT 28.9* 28.4* 29.3* 28.9* 32.8*  MCV 92.6 94.0 94.5 94.4 96.5  PLT 244 257 264 258 310   Cardiac Enzymes: No results for input(s): CKTOTAL, CKMB, CKMBINDEX, TROPONINI in the last 168 hours. BNP: Invalid input(s): POCBNP CBG: No results for input(s): GLUCAP in the last 168 hours. D-Dimer No results for input(s): DDIMER in the last 72 hours. Hgb A1c No results for input(s): HGBA1C in the last 72 hours. Lipid Profile No results for input(s): CHOL, HDL, LDLCALC, TRIG, CHOLHDL, LDLDIRECT in the last 72  hours. Thyroid function studies No results for input(s): TSH, T4TOTAL, T3FREE, THYROIDAB in the last 72 hours.  Invalid input(s): FREET3 Anemia work up No results for input(s): VITAMINB12, FOLATE, FERRITIN, TIBC, IRON, RETICCTPCT in the last 72 hours. Urinalysis No results found for: COLORURINE, APPEARANCEUR, [place], [place], [place], [place], [place], [place], PROTEINUR, UROBILINOGEN, NITRITE, LEUKOCYTESUR Sepsis Labs Invalid input(s): PROCALCITONIN,  WBC,  LACTICIDVEN Microbiology Recent Results (from the past 240 hour(s))  SARS CORONAVIRUS 2 (TAT 6-24 HRS) Nasopharyngeal Nasopharyngeal Swab     Status: None   Collection Time: 06/10/19  4:55 PM   Specimen: Nasopharyngeal Swab  Result Value Ref Range Status   SARS Coronavirus 2 NEGATIVE NEGATIVE Final    Comment: (NOTE) SARS-CoV-2 target nucleic acids are NOT DETECTED. The SARS-CoV-2 RNA is generally detectable in upper and lower respiratory specimens during the acute phase of infection. Negative results do not preclude SARS-CoV-2 infection, do not rule out co-infections with other pathogens, and should not be used as the sole basis for treatment or other patient management decisions. Negative results must be combined with clinical observations, patient history, and epidemiological information. The expected result is Negative. Fact Sheet for Patients: SugarRoll.be Fact Sheet for Healthcare Providers: https://www.woods-mathews.com/ This test is not yet approved or cleared by the [place] FDA and  has been authorized for detection and/or diagnosis of SARS-CoV-2 by FDA under an Emergency Use Authorization (EUA). This EUA will remain  in  effect (meaning this test can be used) for the duration of the COVID-19 declaration under Section 56 4(b)(1) of the Act, 21 U.S.C. section 360bbb-3(b)(1), unless the authorization is terminated or revoked sooner. Performed at [place], [place] [address] [place], [place] [address]   SARS CORONAVIRUS 2 (TAT 6-24 HRS) Nasopharyngeal Nasopharyngeal Swab     Status: None   Collection Time: 06/12/19  1:44 PM   Specimen: Nasopharyngeal Swab  Result Value Ref Range Status   SARS Coronavirus 2 NEGATIVE NEGATIVE Final    Comment: (NOTE) SARS-CoV-2 target nucleic acids are NOT DETECTED. The SARS-CoV-2 RNA is generally detectable in upper and lower respiratory specimens during the acute phase of infection. Negative results do not preclude SARS-CoV-2 infection, do not rule out co-infections with other pathogens, and should not be used as the sole basis for treatment or other patient management decisions. Negative results must be combined with clinical observations, patient history, and epidemiological information. The expected result is Negative. Fact Sheet for Patients: SugarRoll.be Fact Sheet for Healthcare Providers: https://www.woods-mathews.com/ This test is not yet approved or cleared by the [place] FDA and  has been authorized for detection and/or diagnosis of SARS-CoV-2 by FDA under an Emergency Use Authorization (EUA). This EUA will remain  in effect (meaning this test can be used) for the duration of the COVID-19 declaration under Section 56 4(b)(1) of the Act, 21 U.S.C. section 360bbb-3(b)(1), unless the authorization is terminated or revoked sooner. Performed at [facility], [place] [address] [place], [place] [address]      Time coordinating discharge: 40 minutes  SIGNED:   Elmarie Shiley, MD  [facility]

## 2019-06-13 NOTE — Progress Notes (Signed)
  Speech Language Pathology Treatment: Dysphagia  Patient Details Name: Antonio Estes. MRN: [id number] DOB: 11/23/1926 Today's Date: 06/13/2019 Time: 1054-1100 SLP Time Calculation (min) (ACUTE ONLY): 6 min  Assessment / Plan / Recommendation Clinical Impression  Pt seen for ongoing dysphagia management.  Pt tolerated thin liquid, soft solid, and regular solid without any overt s/s of aspiration.  Pt consumed one bite/sip of each and stated he did not want anything else to eat as he has a meal coming.  RN reports no difficulty with POs. Pt took medications crushed with thin liquid.  Discussed diet preference with pt (regular vs. mechanical soft), who stated he does not have a preference given that his is going to rehab today.  SLP will sign off at this time.  Recommend continuing mechanical soft diet with thin liquid.  Reassess at next level of care for safe, least restrictive diet.   HPI HPI: 85 y.o. male with medical history significant of peripheral vascular disease, chronic bilateral lower extremity wounds followed by home health and outpatient wound care center who presented to ED at [facility] on 11/13 following a fall 2 days prior due to generalized weakness. He was subsequently found to have left lower extremity ischemia. Transferred to [facility] for tentative aorta peripheral angiogram and possible treatment of left lower extremity. Incidential finding of aspiration pnemonia.       SLP Plan  All goals met;Discharge SLP treatment due to (comment)       Recommendations  Diet recommendations: Dysphagia 3 (mechanical soft);Thin liquid Liquids provided via: Cup;Straw Medication Administration: Crushed with puree Supervision: Patient able to self feed Compensations: Minimize environmental distractions;Slow rate;Small sips/bites Postural Changes and/or Swallow Maneuvers: Seated upright 90 degrees                Oral Care Recommendations: Oral care BID Follow up  Recommendations: Skilled Nursing facility SLP Visit Diagnosis: Dysphagia, unspecified (R13.10) Plan: All goals met;Discharge SLP treatment due to (comment)       [place], [place], [place] Office: [phone] 06/13/2019, 11:04 AM

## 2019-06-13 NOTE — Progress Notes (Signed)
Report called to Carlota Raspberry, RN at facility.

## 2019-06-13 NOTE — Social Work (Signed)
Clinical Social Worker facilitated patient discharge including contacting patient family and facility to confirm patient discharge plans.  Clinical information faxed to facility and family agreeable with plan.  CSW arranged ambulance transport via [facility] to [facility].  RN to call [phone]  with report prior to discharge.  Clinical Social Worker will sign off for now as social work intervention is no longer needed. Please consult [place] again if new need arises.  Westley Hummer, MSW, [place] Social Worker [phone]

## 2019-06-19 DIAGNOSIS — L0390 Cellulitis, unspecified: Secondary | ICD-10-CM | POA: Diagnosis not present

## 2019-06-19 DIAGNOSIS — E43 Unspecified severe protein-calorie malnutrition: Secondary | ICD-10-CM | POA: Diagnosis not present

## 2019-06-19 DIAGNOSIS — R627 Adult failure to thrive: Secondary | ICD-10-CM | POA: Diagnosis not present

## 2019-06-19 DIAGNOSIS — I739 Peripheral vascular disease, unspecified: Secondary | ICD-10-CM | POA: Diagnosis not present

## 2019-06-29 DIAGNOSIS — I10 Essential (primary) hypertension: Secondary | ICD-10-CM | POA: Diagnosis not present

## 2019-06-29 DIAGNOSIS — Z79899 Other long term (current) drug therapy: Secondary | ICD-10-CM | POA: Diagnosis not present

## 2019-06-29 DIAGNOSIS — I739 Peripheral vascular disease, unspecified: Secondary | ICD-10-CM | POA: Diagnosis not present

## 2019-06-29 DIAGNOSIS — R531 Weakness: Secondary | ICD-10-CM | POA: Diagnosis not present

## 2019-06-29 DIAGNOSIS — J449 Chronic obstructive pulmonary disease, unspecified: Secondary | ICD-10-CM | POA: Diagnosis not present

## 2019-06-29 DIAGNOSIS — R5383 Other fatigue: Secondary | ICD-10-CM | POA: Diagnosis not present

## 2019-06-29 DIAGNOSIS — J690 Pneumonitis due to inhalation of food and vomit: Secondary | ICD-10-CM | POA: Diagnosis not present

## 2019-06-29 DIAGNOSIS — I87311 Chronic venous hypertension (idiopathic) with ulcer of right lower extremity: Secondary | ICD-10-CM | POA: Diagnosis not present

## 2019-06-29 DIAGNOSIS — L97812 Non-pressure chronic ulcer of other part of right lower leg with fat layer exposed: Secondary | ICD-10-CM | POA: Diagnosis not present

## 2019-06-29 DIAGNOSIS — Z9181 History of falling: Secondary | ICD-10-CM | POA: Diagnosis not present

## 2019-06-29 DIAGNOSIS — Z7951 Long term (current) use of inhaled steroids: Secondary | ICD-10-CM | POA: Diagnosis not present

## 2019-06-29 DIAGNOSIS — E039 Hypothyroidism, unspecified: Secondary | ICD-10-CM | POA: Diagnosis not present

## 2019-06-29 DIAGNOSIS — I87312 Chronic venous hypertension (idiopathic) with ulcer of left lower extremity: Secondary | ICD-10-CM | POA: Diagnosis not present

## 2019-06-29 DIAGNOSIS — Z791 Long term (current) use of non-steroidal anti-inflammatories (NSAID): Secondary | ICD-10-CM | POA: Diagnosis not present

## 2019-06-29 DIAGNOSIS — E43 Unspecified severe protein-calorie malnutrition: Secondary | ICD-10-CM | POA: Diagnosis not present

## 2019-06-29 DIAGNOSIS — R2681 Unsteadiness on feet: Secondary | ICD-10-CM | POA: Diagnosis not present

## 2019-06-29 DIAGNOSIS — L0390 Cellulitis, unspecified: Secondary | ICD-10-CM | POA: Diagnosis not present

## 2019-06-30 DIAGNOSIS — S0990XA Unspecified injury of head, initial encounter: Secondary | ICD-10-CM | POA: Diagnosis not present

## 2019-06-30 DIAGNOSIS — S199XXA Unspecified injury of neck, initial encounter: Secondary | ICD-10-CM | POA: Diagnosis not present

## 2019-06-30 DIAGNOSIS — M79671 Pain in right foot: Secondary | ICD-10-CM | POA: Diagnosis not present

## 2019-06-30 DIAGNOSIS — M25572 Pain in left ankle and joints of left foot: Secondary | ICD-10-CM | POA: Diagnosis not present

## 2019-06-30 DIAGNOSIS — Z23 Encounter for immunization: Secondary | ICD-10-CM | POA: Diagnosis not present

## 2019-06-30 DIAGNOSIS — S0001XA Abrasion of scalp, initial encounter: Secondary | ICD-10-CM | POA: Diagnosis not present

## 2019-06-30 DIAGNOSIS — S0003XA Contusion of scalp, initial encounter: Secondary | ICD-10-CM | POA: Diagnosis not present

## 2019-06-30 DIAGNOSIS — S0101XA Laceration without foreign body of scalp, initial encounter: Secondary | ICD-10-CM | POA: Diagnosis not present

## 2019-07-03 DIAGNOSIS — E43 Unspecified severe protein-calorie malnutrition: Secondary | ICD-10-CM | POA: Diagnosis not present

## 2019-07-03 DIAGNOSIS — I739 Peripheral vascular disease, unspecified: Secondary | ICD-10-CM | POA: Diagnosis not present

## 2019-07-03 DIAGNOSIS — R627 Adult failure to thrive: Secondary | ICD-10-CM | POA: Diagnosis not present

## 2019-07-03 DIAGNOSIS — L0390 Cellulitis, unspecified: Secondary | ICD-10-CM | POA: Diagnosis not present

## 2019-07-05 ENCOUNTER — Other Ambulatory Visit: Payer: Self-pay | Admitting: *Deleted

## 2019-07-05 NOTE — Patient Outreach (Signed)
HTA NP Referral to follow up on pt post SNF discharge. Presented with severe PVD, LE wounds, cellulitis. Had procedure improving his perfusion. Treatment of antibiotics and continued plavix and ASA. Also noted was severe portein caloric malnutrition, aspiration pneumonia.  He was discharge from SNF recently and has home health services. There are concerns that his home situation may not be safe or he may not have the resources he needs.  Called and pt answered. His home health nurse was there so I offered to call later which he appreciated.  Called pt again at 4:00 pm. He states he can't talk because he is going to town. He says someone is taking him. I asked if I could call him tomorrow and he says that I can call him in the morning.  Eulah Pont. Myrtie Neither, MSN, [facility] Gerontological Nurse Practitioner [facility] Care Management [phone]

## 2019-07-06 ENCOUNTER — Other Ambulatory Visit: Payer: Self-pay | Admitting: *Deleted

## 2019-07-06 NOTE — Patient Outreach (Signed)
Second telephone outreach to pt. No answer today and unable to leave a message.  Gage and talked with Harriet Masson, RN who reopened pt case yesterday. She reports the pt is in a pretty dismal state. PT is to visit him today and his regular nurse Arlys John, RN will see him tomorrow.  Called and talked with Helene Kelp. She reports that Mr. Sarracino is not safe at home alone and that she could not believe that he was discharged home. He does not have anyone to assist him. His preacher comes around every once in while. Pt has hx of frequent falls, in fact Helene Kelp was the one who found him before this hospitalization and called 911. She reports he was getting wound care previously and his wounds drain so much that he cannot keep the dressings on. He is constantly having to change them. The wounds drain on the floor and make it slippery and this is one reason he has fallen in the past. He has a poor supply of food and chooses to eat one item instead of a meal, for example a serving of collard greens.  Clarene Critchley said she has put in an emergency social worker referral and [place] Ar should be seeing him today or tomorrow.  Helene Kelp advocates for Mr. Borum to be placed for his safety, wound care, nutrtional support and general well-being.  I will continue attempts to engage pt but for now will follow progress from home health.  Eulah Pont. Myrtie Neither, MSN, [facility] Gerontological Nurse Practitioner [facility] Care Management [phone]

## 2019-07-17 DIAGNOSIS — L97822 Non-pressure chronic ulcer of other part of left lower leg with fat layer exposed: Secondary | ICD-10-CM | POA: Diagnosis not present

## 2019-07-17 DIAGNOSIS — L97329 Non-pressure chronic ulcer of left ankle with unspecified severity: Secondary | ICD-10-CM | POA: Diagnosis not present

## 2019-07-17 DIAGNOSIS — L97812 Non-pressure chronic ulcer of other part of right lower leg with fat layer exposed: Secondary | ICD-10-CM | POA: Diagnosis not present

## 2019-07-17 DIAGNOSIS — I739 Peripheral vascular disease, unspecified: Secondary | ICD-10-CM | POA: Diagnosis not present

## 2019-07-17 DIAGNOSIS — L97322 Non-pressure chronic ulcer of left ankle with fat layer exposed: Secondary | ICD-10-CM | POA: Diagnosis not present

## 2019-07-17 DIAGNOSIS — I70238 Atherosclerosis of native arteries of right leg with ulceration of other part of lower right leg: Secondary | ICD-10-CM | POA: Diagnosis not present

## 2019-07-22 DIAGNOSIS — M6282 Rhabdomyolysis: Secondary | ICD-10-CM

## 2019-07-22 DIAGNOSIS — L0390 Cellulitis, unspecified: Secondary | ICD-10-CM | POA: Diagnosis not present

## 2019-07-22 DIAGNOSIS — E860 Dehydration: Secondary | ICD-10-CM

## 2019-07-22 DIAGNOSIS — M25551 Pain in right hip: Secondary | ICD-10-CM

## 2019-07-22 DIAGNOSIS — L97919 Non-pressure chronic ulcer of unspecified part of right lower leg with unspecified severity: Secondary | ICD-10-CM

## 2019-07-22 DIAGNOSIS — E46 Unspecified protein-calorie malnutrition: Secondary | ICD-10-CM

## 2019-07-23 DIAGNOSIS — E46 Unspecified protein-calorie malnutrition: Secondary | ICD-10-CM | POA: Diagnosis not present

## 2019-07-23 DIAGNOSIS — L0390 Cellulitis, unspecified: Secondary | ICD-10-CM | POA: Diagnosis not present

## 2019-07-23 DIAGNOSIS — M6282 Rhabdomyolysis: Secondary | ICD-10-CM | POA: Diagnosis not present

## 2019-07-23 DIAGNOSIS — E860 Dehydration: Secondary | ICD-10-CM | POA: Diagnosis not present

## 2019-07-24 DIAGNOSIS — M6282 Rhabdomyolysis: Secondary | ICD-10-CM | POA: Diagnosis not present

## 2019-07-24 DIAGNOSIS — E860 Dehydration: Secondary | ICD-10-CM | POA: Diagnosis not present

## 2019-07-24 DIAGNOSIS — E46 Unspecified protein-calorie malnutrition: Secondary | ICD-10-CM | POA: Diagnosis not present

## 2019-07-24 DIAGNOSIS — L0390 Cellulitis, unspecified: Secondary | ICD-10-CM | POA: Diagnosis not present

## 2019-07-25 DIAGNOSIS — L0390 Cellulitis, unspecified: Secondary | ICD-10-CM | POA: Diagnosis not present

## 2019-07-25 DIAGNOSIS — E860 Dehydration: Secondary | ICD-10-CM | POA: Diagnosis not present

## 2019-07-25 DIAGNOSIS — M6282 Rhabdomyolysis: Secondary | ICD-10-CM | POA: Diagnosis not present

## 2019-07-25 DIAGNOSIS — E46 Unspecified protein-calorie malnutrition: Secondary | ICD-10-CM | POA: Diagnosis not present

## 2019-07-26 DIAGNOSIS — L0390 Cellulitis, unspecified: Secondary | ICD-10-CM | POA: Diagnosis not present

## 2019-07-26 DIAGNOSIS — E860 Dehydration: Secondary | ICD-10-CM | POA: Diagnosis not present

## 2019-07-26 DIAGNOSIS — E46 Unspecified protein-calorie malnutrition: Secondary | ICD-10-CM | POA: Diagnosis not present

## 2019-07-26 DIAGNOSIS — M6282 Rhabdomyolysis: Secondary | ICD-10-CM | POA: Diagnosis not present

## 2019-07-27 DIAGNOSIS — E46 Unspecified protein-calorie malnutrition: Secondary | ICD-10-CM | POA: Diagnosis not present

## 2019-07-27 DIAGNOSIS — M6282 Rhabdomyolysis: Secondary | ICD-10-CM | POA: Diagnosis not present

## 2019-07-27 DIAGNOSIS — L0390 Cellulitis, unspecified: Secondary | ICD-10-CM | POA: Diagnosis not present

## 2019-07-27 DIAGNOSIS — E860 Dehydration: Secondary | ICD-10-CM | POA: Diagnosis not present

## 2019-11-28 DIAGNOSIS — E039 Hypothyroidism, unspecified: Secondary | ICD-10-CM | POA: Diagnosis not present

## 2019-11-28 DIAGNOSIS — E860 Dehydration: Secondary | ICD-10-CM

## 2019-11-28 DIAGNOSIS — E035 Myxedema coma: Secondary | ICD-10-CM

## 2019-11-28 DIAGNOSIS — R531 Weakness: Secondary | ICD-10-CM

## 2019-11-28 DIAGNOSIS — T68XXXA Hypothermia, initial encounter: Secondary | ICD-10-CM

## 2019-11-29 DIAGNOSIS — E035 Myxedema coma: Secondary | ICD-10-CM | POA: Diagnosis not present

## 2019-11-29 DIAGNOSIS — E860 Dehydration: Secondary | ICD-10-CM | POA: Diagnosis not present

## 2019-11-29 DIAGNOSIS — E039 Hypothyroidism, unspecified: Secondary | ICD-10-CM | POA: Diagnosis not present

## 2019-11-29 DIAGNOSIS — T68XXXA Hypothermia, initial encounter: Secondary | ICD-10-CM | POA: Diagnosis not present

## 2019-11-30 DIAGNOSIS — E860 Dehydration: Secondary | ICD-10-CM | POA: Diagnosis not present

## 2019-11-30 DIAGNOSIS — T68XXXA Hypothermia, initial encounter: Secondary | ICD-10-CM | POA: Diagnosis not present

## 2019-11-30 DIAGNOSIS — E035 Myxedema coma: Secondary | ICD-10-CM | POA: Diagnosis not present

## 2019-11-30 DIAGNOSIS — E039 Hypothyroidism, unspecified: Secondary | ICD-10-CM | POA: Diagnosis not present

## 2019-12-01 DIAGNOSIS — E860 Dehydration: Secondary | ICD-10-CM | POA: Diagnosis not present

## 2019-12-01 DIAGNOSIS — E035 Myxedema coma: Secondary | ICD-10-CM | POA: Diagnosis not present

## 2019-12-01 DIAGNOSIS — E039 Hypothyroidism, unspecified: Secondary | ICD-10-CM | POA: Diagnosis not present

## 2019-12-01 DIAGNOSIS — T68XXXA Hypothermia, initial encounter: Secondary | ICD-10-CM | POA: Diagnosis not present

## 2019-12-19 DEATH — deceased

## 2021-04-14 IMAGING — XA IR ANGIO/EXT/UNI*L*
12 of 24 series · 12 of 24 positions shown · IV contrast (IODINE)
Comparison: none

INDICATION: [AGE] male with left lower extremity critical limb ischemia

[Series 2: body 4 care · 1 of 7 slices shown (1 of 9)]
[im 1/7]
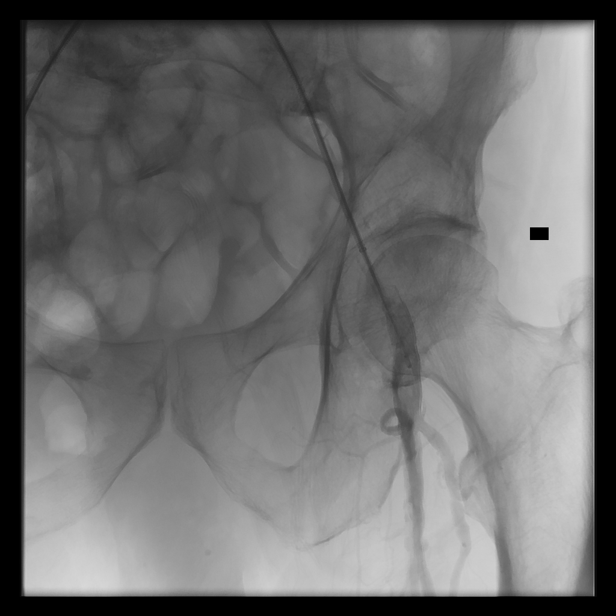

[Series 5: body 4 care · 1 of 10 slices shown (2 of 9)]
[im 1/10]
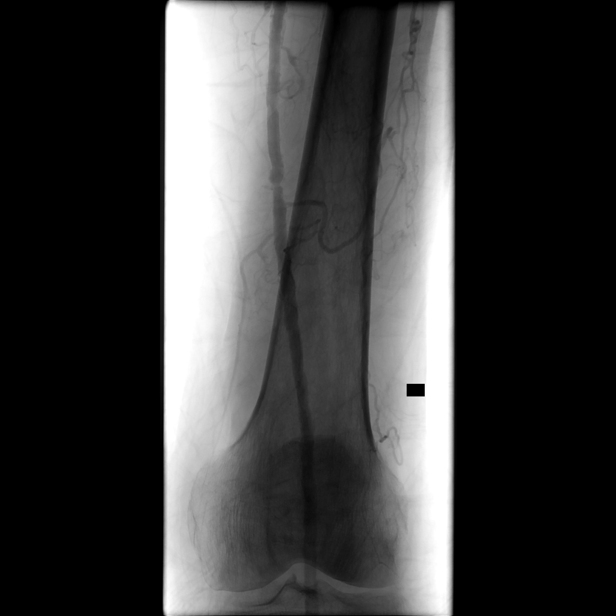

[Series 7: body 4 care · 1 of 13 slices shown (3 of 9)]
[im 1/13]
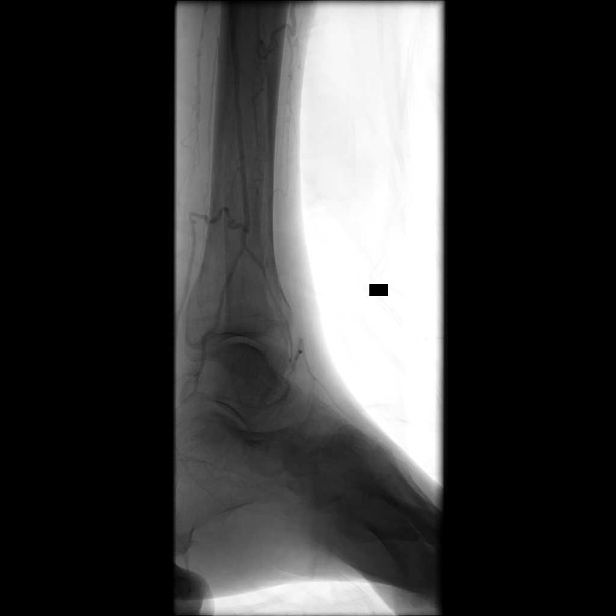

[Series 9: body 4 care · 1 of 4 slices shown (4 of 9)]
[im 1/4]
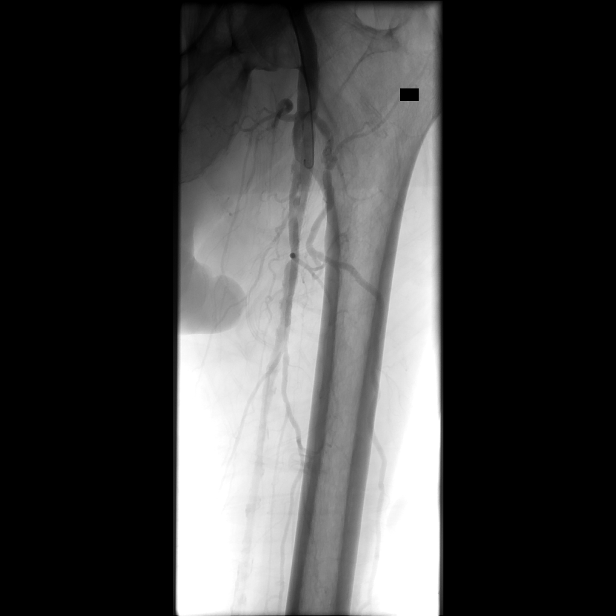

[Series 11: body 4 care · 1 of 5 slices shown (5 of 9)]
[im 1/5]
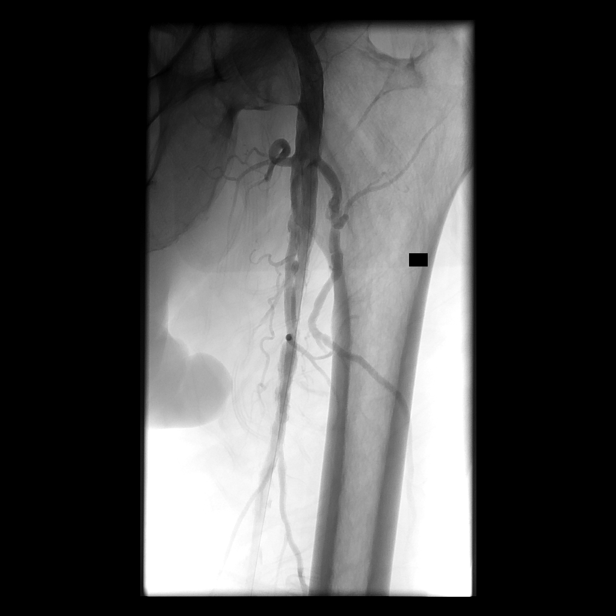

[Series 13: body 4 care · 1 of 5 slices shown (6 of 9)]
[im 1/5]
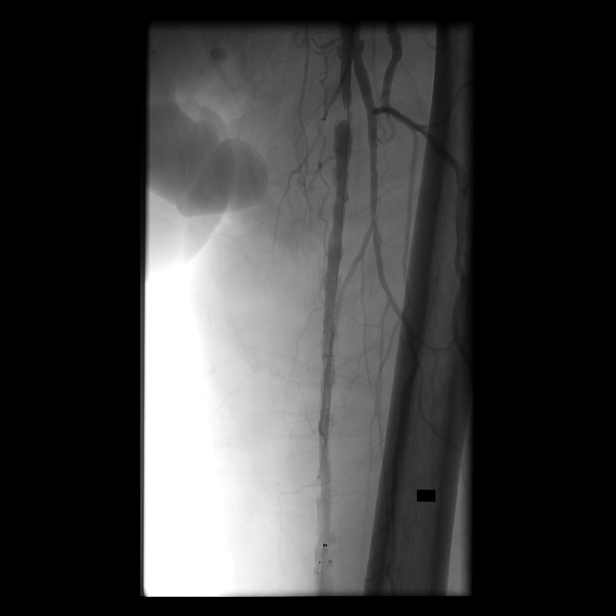

[Series 15: fl (-) angio · 1 of 27 slices shown (1 of 3)]
[im 1/27]
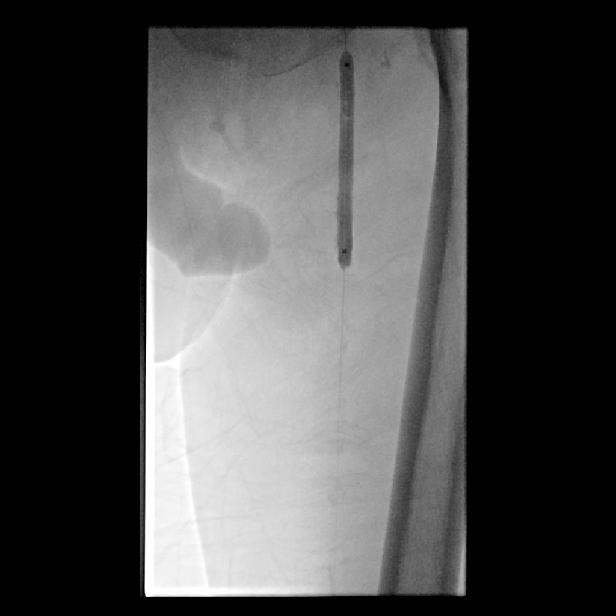

[Series 17: body 4 care · 1 of 7 slices shown (7 of 9)]
[im 1/7]
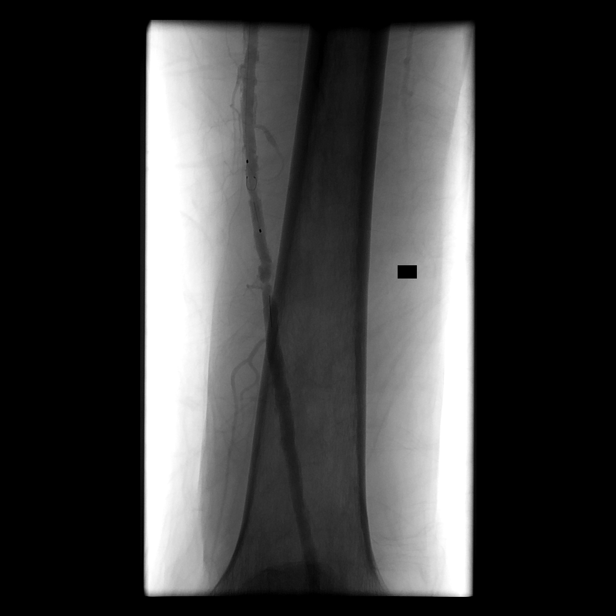

[Series 19: fl (-) angio · 1 of 1 slices shown (2 of 3)]
[im 1/1]
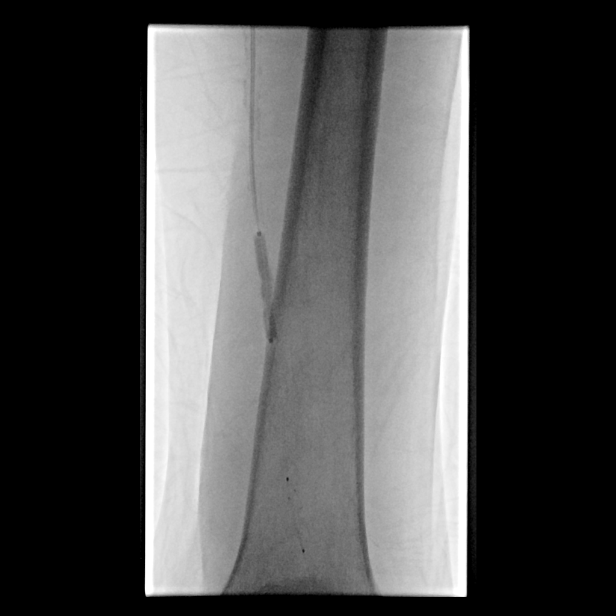

[Series 21: body 4 care · 1 of 6 slices shown (8 of 9)]
[im 1/6]
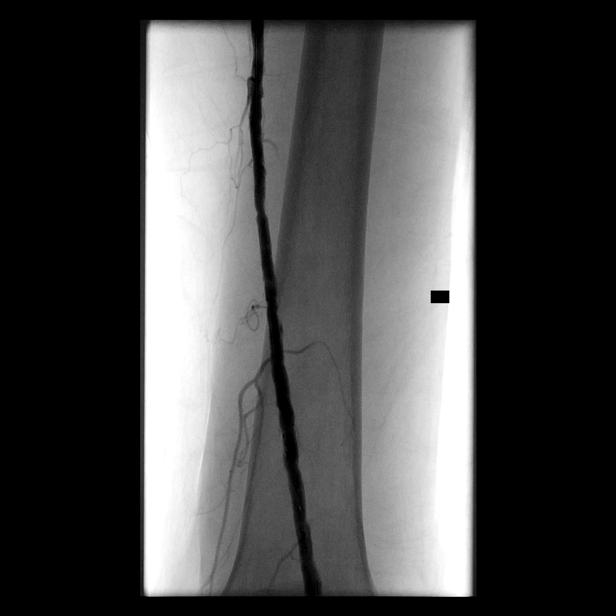

[Series 23: fl (-) angio · 1 of 1 slices shown (3 of 3)]
[im 1/1]
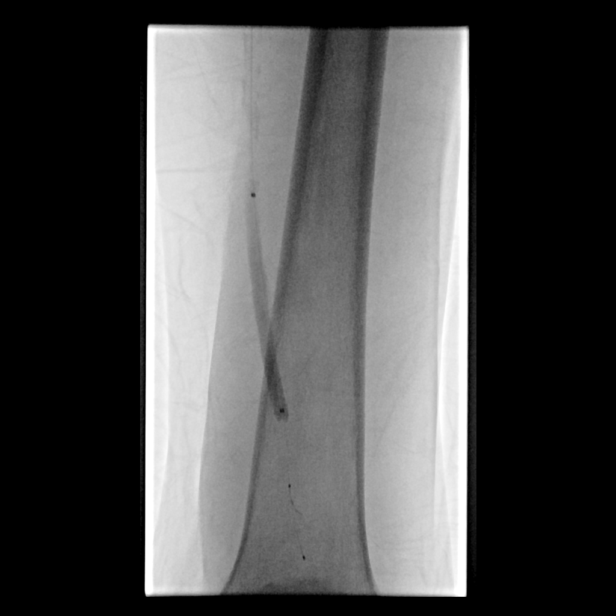

[Series 25: body 4 care · 1 of 6 slices shown (9 of 9)]
[im 1/6]
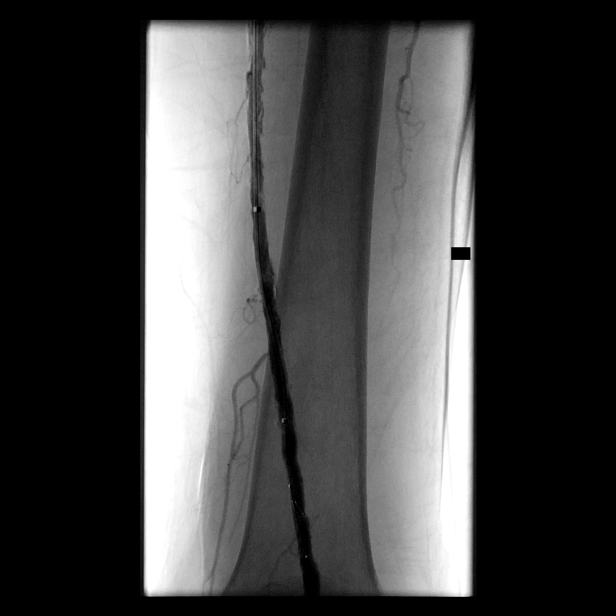

[12 of 24 positions shown; findings below may reference images not displayed]

ABI 06/05/2019

Right:

Left:

EXAM:
ULTRASOUND GUIDED ACCESS RIGHT COMMON FEMORAL ARTERY

LEFT LOWER EXTREMITY ANGIOGRAM

TREATMENT OF TANDEM CRITICAL STENOSIS OF LEFT SFA WITH DIRECTIONAL
ATHERECTOMY AND DRUG-ELUTING BALLOON, AS WELL AS DRUG-ELUTING
BALLOON

ANGIO-SEAL FOR HEMOSTASIS

MEDICATIONS:
8666 units IV heparin, 50 mg IV protamine sulfate

ANESTHESIA/SEDATION:
Moderate (conscious) sedation was employed during this procedure. A
total of Versed 1.0 mg and Fentanyl 25 mcg was administered
intravenously.

Moderate Sedation Time: 60 minutes. The patient's level of
consciousness and vital signs were monitored continuously by
radiology nursing throughout the procedure under my direct
supervision.

CONTRAST:  70 cc visit base

FLUOROSCOPY TIME:  Fluoroscopy Time: 9 minutes 18 seconds (40 mGy).

COMPLICATIONS:
None



Ultrasound survey of the right inguinal region was performed with
images stored and sent to PACs, confirming patency of the vessel.

1% lidocaine used for local anesthesia. Small stab incision was made
with 11 blade scalpel. Blunt dissection was performed under
ultrasound. A micropuncture needle was used access the right common
femoral artery under ultrasound. With excellent arterial blood flow
returned, and an .018 micro wire was passed through the needle,
observed enter the abdominal aorta under fluoroscopy. The needle was
removed, and a micropuncture sheath was placed over the wire. The
inner dilator and wire were removed, and an 035 Bentson wire was
advanced under fluoroscopy into the abdominal aorta. The sheath was
removed and a standard 5 French vascular sheath was placed. The
dilator was removed and the sheath was flushed.

Four French omni Flush sheath was advanced to the bifurcation.
Bentson wire and a Glidewire were used to navigate into the distal
external iliac artery. Omni Flush sheath was advanced to the distal
external iliac artery. Rose in wire was placed into the common
femoral artery.

Six French destination sheath was then placed over the bifurcation.

Angiogram of the left lower extremity was performed.

IV heparinization was performed with 8666 units.

A Glidewire and vertebral catheter we were used to navigate through
the proximal critical stenosis and into the distal SFA.

A 7 mm spider wire protection device was deployed. Directional
atherectomy was performed.

Drug-eluting balloon angioplasty was then performed at the proximal
SFA with a 6 mm x 60 mm impact balloon.

Angiogram was performed.

The protection device had migrated proximally. We used the retrieval
device to reposition beyond the more distal critical stenosis in the
adductor canal. With the protection device redeployed, standard
balloon angioplasty was performed at the second treatment site, 5 mm
by 40 mm. We then elected to treat this site with a 5 mm by 60 mm
impact balloon.

Angiogram was performed confirming excellent flow. The protection
device was retrieved through the 035 balloon and the balloon was
removed with the protection device.

Final angiogram was performed of the left lower extremity.

Angio-Seal was deployed at the right common femoral artery.

Pulses confirmed at the left foot and the right foot.

Patient tolerated procedure well and remained hemodynamically stable
throughout.

No complications were encountered and no significant blood loss.
FINDINGS: Proximal SFA lesion greater than 90% stenosis pre treatment

Proximal SFA lesion with no residual stenosis post treatment

Distal SFA lesion with 80% stenosis pre treatment

Distal SFA lesion with no residual stenosis post treatment
IMPRESSION: Status post ultrasound guided access right common femoral artery for
left lower extremity angiogram and treatment of tandem high-grade
stenosis of the superficial femoral artery with directional
atherectomy/drug-eluting balloon, and PTA/drug-eluting balloon
restoring excellent flow with no residual stenosis.

Angio-Seal for hemostasis.
